# Patient Record
Sex: Male | Born: 1975 | Hispanic: No | Marital: Married | Smoking: Former smoker
Health system: Southern US, Community
[De-identification: ages and names within clinical notes are randomized; demographics above are authoritative.]

## PROBLEM LIST (undated history)

## (undated) DIAGNOSIS — G43909 Migraine, unspecified, not intractable, without status migrainosus: Secondary | ICD-10-CM

## (undated) DIAGNOSIS — T4145XA Adverse effect of unspecified anesthetic, initial encounter: Secondary | ICD-10-CM

## (undated) DIAGNOSIS — K219 Gastro-esophageal reflux disease without esophagitis: Secondary | ICD-10-CM

## (undated) DIAGNOSIS — F329 Major depressive disorder, single episode, unspecified: Secondary | ICD-10-CM

## (undated) DIAGNOSIS — K602 Anal fissure, unspecified: Secondary | ICD-10-CM

## (undated) DIAGNOSIS — F32A Depression, unspecified: Secondary | ICD-10-CM

## (undated) DIAGNOSIS — T8859XA Other complications of anesthesia, initial encounter: Secondary | ICD-10-CM

## (undated) DIAGNOSIS — F419 Anxiety disorder, unspecified: Secondary | ICD-10-CM

## (undated) HISTORY — DX: Gastro-esophageal reflux disease without esophagitis: K21.9

## (undated) HISTORY — DX: Anal fissure, unspecified: K60.2

## (undated) HISTORY — DX: Migraine, unspecified, not intractable, without status migrainosus: G43.909

## (undated) HISTORY — DX: Major depressive disorder, single episode, unspecified: F32.9

## (undated) HISTORY — PX: TONSILLECTOMY: SUR1361

## (undated) HISTORY — DX: Anxiety disorder, unspecified: F41.9

## (undated) HISTORY — PX: UPPER GASTROINTESTINAL ENDOSCOPY: SHX188

## (undated) HISTORY — DX: Depression, unspecified: F32.A

---

## 2006-05-21 ENCOUNTER — Ambulatory Visit: Payer: Self-pay | Admitting: Internal Medicine

## 2006-07-08 ENCOUNTER — Ambulatory Visit: Payer: Self-pay | Admitting: Internal Medicine

## 2006-10-07 ENCOUNTER — Ambulatory Visit: Payer: Self-pay | Admitting: Internal Medicine

## 2007-05-20 ENCOUNTER — Encounter: Payer: Self-pay | Admitting: *Deleted

## 2007-12-10 ENCOUNTER — Telehealth: Payer: Self-pay | Admitting: Internal Medicine

## 2007-12-30 ENCOUNTER — Ambulatory Visit: Payer: Self-pay | Admitting: Internal Medicine

## 2007-12-30 DIAGNOSIS — K612 Anorectal abscess: Secondary | ICD-10-CM | POA: Insufficient documentation

## 2007-12-30 DIAGNOSIS — F411 Generalized anxiety disorder: Secondary | ICD-10-CM | POA: Insufficient documentation

## 2007-12-30 DIAGNOSIS — K5730 Diverticulosis of large intestine without perforation or abscess without bleeding: Secondary | ICD-10-CM | POA: Insufficient documentation

## 2007-12-30 DIAGNOSIS — H109 Unspecified conjunctivitis: Secondary | ICD-10-CM | POA: Insufficient documentation

## 2007-12-30 DIAGNOSIS — G43009 Migraine without aura, not intractable, without status migrainosus: Secondary | ICD-10-CM | POA: Insufficient documentation

## 2008-02-28 ENCOUNTER — Ambulatory Visit: Payer: Self-pay | Admitting: Internal Medicine

## 2008-06-19 ENCOUNTER — Telehealth (INDEPENDENT_AMBULATORY_CARE_PROVIDER_SITE_OTHER): Payer: Self-pay | Admitting: *Deleted

## 2008-08-10 ENCOUNTER — Ambulatory Visit: Payer: Self-pay | Admitting: Internal Medicine

## 2008-10-06 ENCOUNTER — Ambulatory Visit: Payer: Self-pay | Admitting: Internal Medicine

## 2008-11-13 ENCOUNTER — Ambulatory Visit: Payer: Self-pay | Admitting: Internal Medicine

## 2009-01-12 ENCOUNTER — Ambulatory Visit: Payer: Self-pay | Admitting: Internal Medicine

## 2009-04-03 ENCOUNTER — Ambulatory Visit: Payer: Self-pay | Admitting: Internal Medicine

## 2010-08-01 ENCOUNTER — Ambulatory Visit: Payer: Self-pay | Admitting: Internal Medicine

## 2010-10-11 ENCOUNTER — Ambulatory Visit
Admission: RE | Admit: 2010-10-11 | Discharge: 2010-10-11 | Payer: Self-pay | Source: Home / Self Care | Attending: Internal Medicine | Admitting: Internal Medicine

## 2010-10-21 ENCOUNTER — Ambulatory Visit
Admission: RE | Admit: 2010-10-21 | Discharge: 2010-10-21 | Payer: Self-pay | Source: Home / Self Care | Attending: Internal Medicine | Admitting: Internal Medicine

## 2011-02-07 NOTE — Assessment & Plan Note (Signed)
[facility]                                 ON-CALL NOTE   NAME:CUDADerrell, Milanes                         MRN:          [id number]  DATE:10/31/2007                            DOB:          1976-09-16    TIME OF CALL:  2:22 p.m.   CALLER:  Elisah Parmer.   He sees Dr. Jonny Ruiz.   PHONE NUMBER:  [phone].   The patient is calling complaining of severe back pain.  He says he  develops pain in his lower back from time to time, and usually over-the-  counter pain medications are adequate.  Today, he has had some back pain  again, which is not responding to 800 mg of Motrin every 6 hours.  He  has been resting and applying heat.  He has no known drug allergies.  My  response is to call in Flexeril 10 mg to take every 8 hours as needed  #30 with no refills to [facility] at [phone].  He can follow up with Dr. Jonny Ruiz  later this week if he does not improve.     Tera Mater. Clent Ridges, MD  Electronically Signed    SAF/MedQ  DD: 10/31/2007  DT: 11/01/2007  Job #: [id number]

## 2011-03-03 ENCOUNTER — Encounter: Payer: Self-pay | Admitting: Internal Medicine

## 2011-03-03 ENCOUNTER — Ambulatory Visit (INDEPENDENT_AMBULATORY_CARE_PROVIDER_SITE_OTHER): Payer: BC Managed Care – PPO | Admitting: Internal Medicine

## 2011-03-03 VITALS — BP 106/78 | HR 84 | Temp 97.8°F | Ht 68.5 in | Wt 201.0 lb

## 2011-03-03 DIAGNOSIS — F419 Anxiety disorder, unspecified: Secondary | ICD-10-CM

## 2011-03-03 DIAGNOSIS — J329 Chronic sinusitis, unspecified: Secondary | ICD-10-CM

## 2011-03-03 DIAGNOSIS — F411 Generalized anxiety disorder: Secondary | ICD-10-CM

## 2011-03-03 LAB — CBC WITH DIFFERENTIAL/PLATELET
Eosinophils Absolute: 0.1 10*3/uL (ref 0.0–0.7)
Lymphocytes Relative: 45 % (ref 12–46)
Lymphs Abs: 2.9 10*3/uL (ref 0.7–4.0)
Neutro Abs: 2.9 10*3/uL (ref 1.7–7.7)
Neutrophils Relative %: 46 % (ref 43–77)
Platelets: 145 10*3/uL — ABNORMAL LOW (ref 150–400)
RBC: 5.6 MIL/uL (ref 4.22–5.81)
WBC: 6.3 10*3/uL (ref 4.0–10.5)

## 2011-03-04 ENCOUNTER — Encounter: Payer: Self-pay | Admitting: Internal Medicine

## 2011-03-04 NOTE — Progress Notes (Signed)
  Subjective:    Patient ID: Brad Holland, male    DOB: June 21, 1976, 35 y.o.   MRN: [id number]  HPI URI symptoms for apprx 3 weeks with persistent congestion. Some fatigue. Did have some productive cough which has improved.Going to [place] in a couple of weeks. Some anxiety about trip. Last trip to [place] had to use oxygen briefly because of anxiety. Took more Klonopin on return trip and did better.    Review of Systems     Objective:   Physical Exam  HENT:  Head: Normocephalic.  Right Ear: External ear normal.  Left Ear: External ear normal.  Mouth/Throat: Oropharynx is clear and moist. No oropharyngeal exudate.  Eyes: Right eye exhibits no discharge. Left eye exhibits no discharge. No scleral icterus.  Neck: Neck supple.  Pulmonary/Chest: Breath sounds normal. No respiratory distress. He has no wheezes. He has no rales. He exhibits no tenderness.  Lymphadenopathy:    He has no cervical adenopathy.           Assessment & Plan:  Sinusitis Anxiety Plan is for pt to take enough Klonopin prior to airline departure to avoid panic disorder. Have prescribed Augmentin 875 mg bid for 10 days with 1 refill

## 2011-03-04 NOTE — Patient Instructions (Signed)
Take meds as directed. Call if symptoms do not improve

## 2011-03-11 ENCOUNTER — Telehealth: Payer: Self-pay | Admitting: Internal Medicine

## 2011-03-11 NOTE — Telephone Encounter (Signed)
Not worried. Some people have slightly low counts. And I did notice this trend in him previously.

## 2011-05-27 ENCOUNTER — Other Ambulatory Visit: Payer: Self-pay | Admitting: *Deleted

## 2011-05-27 MED ORDER — ESCITALOPRAM OXALATE 20 MG PO TABS: ORAL_TABLET | ORAL | Status: DC

## 2011-08-04 ENCOUNTER — Ambulatory Visit (INDEPENDENT_AMBULATORY_CARE_PROVIDER_SITE_OTHER): Payer: BC Managed Care – PPO | Admitting: Internal Medicine

## 2011-08-04 ENCOUNTER — Encounter: Payer: Self-pay | Admitting: Internal Medicine

## 2011-08-04 VITALS — BP 116/84 | HR 72 | Temp 97.7°F | Wt 204.0 lb

## 2011-08-04 DIAGNOSIS — F411 Generalized anxiety disorder: Secondary | ICD-10-CM

## 2011-08-04 DIAGNOSIS — J069 Acute upper respiratory infection, unspecified: Secondary | ICD-10-CM

## 2011-08-04 DIAGNOSIS — F419 Anxiety disorder, unspecified: Secondary | ICD-10-CM

## 2011-08-04 NOTE — Progress Notes (Signed)
  Subjective:    Patient ID: Brad Holland, male    DOB: Jan 07, 1976, 35 y.o.   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 He had some GI issues with diarrhea and developed respiratory congestion. Has not gotten completely better with respiratory congestion. Also is felt some odd tingling when he takes a deep breath throughout his neck and shoulders and arms. It's almost like  hyperventilation syndrome. He is anxious. He's worried about his history of smoking. Has not smoked any in 3 weeks says he was in [place]. Was found to one or 2 cigarettes daily. Previously smoked for some 15 years but quit about 3 years ago and never smoked very heavily. Now is worried about lung cancer. He has a dry cough.    Review of Systems     Objective:   Physical Exam HEENT exam: TMs and pharynx are clear; neck is supple without significant adenopathy; chest: clear to auscultation without rales or wheezes.        Assessment & Plan:  Upper respiratory infection  Anxiety  Paresthesias  Plan: Patient given prescription for Levaquin 500 milligrams daily for 10 days. Reassured patient that it was unlikely he has serious illness. He will call if not better in 2 weeks.

## 2011-08-05 NOTE — Patient Instructions (Signed)
Please take antibiotic as prescribed. Call if not better in 2 weeks.

## 2011-08-08 DIAGNOSIS — F411 Generalized anxiety disorder: Secondary | ICD-10-CM

## 2011-08-11 ENCOUNTER — Other Ambulatory Visit: Payer: Self-pay

## 2011-08-11 MED ORDER — CLONAZEPAM 0.5 MG PO TABS: 0.5000 mg | ORAL_TABLET | Freq: Three times a day (TID) | ORAL | Status: DC | PRN

## 2011-08-26 ENCOUNTER — Encounter: Payer: Self-pay | Admitting: Internal Medicine

## 2011-08-26 ENCOUNTER — Ambulatory Visit (INDEPENDENT_AMBULATORY_CARE_PROVIDER_SITE_OTHER): Payer: BC Managed Care – PPO | Admitting: Internal Medicine

## 2011-08-26 VITALS — BP 114/80 | Temp 97.3°F | Wt 204.0 lb

## 2011-08-26 DIAGNOSIS — F419 Anxiety disorder, unspecified: Secondary | ICD-10-CM

## 2011-08-26 DIAGNOSIS — F411 Generalized anxiety disorder: Secondary | ICD-10-CM

## 2011-08-26 DIAGNOSIS — R209 Unspecified disturbances of skin sensation: Secondary | ICD-10-CM

## 2011-08-26 DIAGNOSIS — R0789 Other chest pain: Secondary | ICD-10-CM

## 2011-08-26 DIAGNOSIS — R202 Paresthesia of skin: Secondary | ICD-10-CM

## 2011-08-26 NOTE — Patient Instructions (Addendum)
Please be reassured that her pulse oximetry is normal. Please have chest x-ray and return later this week for fasting lab work. We will get an   appointment for you to see cardiologist. Appontment scheduled with Dr. Excell Seltzer at [facility] cardiology for Jan. 8th at 9:15. Patient informed.

## 2011-08-26 NOTE — Progress Notes (Signed)
  Subjective:    Patient ID: Brad Holland, male    DOB: 1975-09-29, 35 y.o.   MRN: [id number]  HPI patient seen here recently with what was thought to be basically anxiety. He has been having some vague chest tightness and some paresthesias down both arms. Says at times he is a bit short of breath. Doesn't really exercise very much. He is a former smoker. Never smoked more than a half to one pack a week but smoked for several years. Male thinks he might have lung cancer. He sees a Warden/ranger and recently saw a psychiatrist. His Lexapro was increased to 40 mg daily. He has Klonopin for anxiety. Both psychiatrist and psychologist suggested that he have his physical complaints completely checked out. That is why he is back today. His pulse oximetry is 97% on room air. Attempted to reassure him. He also has some paresthesias down his back which she says persist throughout the day. 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 Denies having stress at work or at home. Long-standing history of anxiety disorder. Paresthesias are more pronounced when he takes a deep breath.    Review of Systems     Objective:   Physical Exam HEENT exam: He has boggy nasal mucosa bilaterally, TMs are clear, pharynx is clear, neck is supple without adenopathy. His chest is clear to auscultation. Cardiac exam regular rate and rhythm normal S1 and S2 without murmur. Abdomen no hepatosplenomegaly masses or tenderness. Deep tendon reflexes are 2+ and symmetrical in the upper and lower extremities. Muscle strength is normal in the arms and legs.        Assessment & Plan:  It is my opinion that he has mostly an anxiety disorder based on the fact that the paresthesias seem to be more pronounced when he takes deep breaths much like hyperventilation syndrome. However he complains of some vague chest tightness at rest. No fracture chest pain. EKG reveals some borderline LVH. He is quite anxious about the results of his EKG. Is eager to have  chest x-ray to rule out lung cancer. He will have that in the near future. We'll set him up to see cardiologist for further evaluation of chest discomfort. Continue with Lexapro and Klonopin. He will return for fasting lab work later this week.

## 2011-08-27 NOTE — Progress Notes (Signed)
Addended by: Judy Pimple on: 08/27/2011 01:14 PM   Modules accepted: Orders

## 2011-08-29 ENCOUNTER — Other Ambulatory Visit (INDEPENDENT_AMBULATORY_CARE_PROVIDER_SITE_OTHER): Payer: BC Managed Care – PPO | Admitting: Internal Medicine

## 2011-08-29 ENCOUNTER — Ambulatory Visit
Admission: RE | Admit: 2011-08-29 | Discharge: 2011-08-29 | Disposition: A | Discharge status: Home / Self Care | Payer: BC Managed Care – PPO | Source: Ambulatory Visit | Attending: Internal Medicine | Admitting: Internal Medicine

## 2011-08-29 DIAGNOSIS — Z0000 Encounter for general adult medical examination without abnormal findings: Secondary | ICD-10-CM

## 2011-08-29 DIAGNOSIS — R0789 Other chest pain: Secondary | ICD-10-CM

## 2011-08-29 DIAGNOSIS — R0602 Shortness of breath: Secondary | ICD-10-CM

## 2011-08-29 LAB — CBC WITH DIFFERENTIAL/PLATELET
Basophils Absolute: 0 10*3/uL (ref 0.0–0.1)
Basophils Relative: 0 % (ref 0–1)
MCHC: 34.4 g/dL (ref 30.0–36.0)
Neutro Abs: 3.3 10*3/uL (ref 1.7–7.7)
Neutrophils Relative %: 51 % (ref 43–77)
RDW: 13.8 % (ref 11.5–15.5)

## 2011-08-29 LAB — COMPREHENSIVE METABOLIC PANEL
ALT: 25 U/L (ref 0–53)
AST: 24 U/L (ref 0–37)
Albumin: 4.7 g/dL (ref 3.5–5.2)
Alkaline Phosphatase: 47 U/L (ref 39–117)
BUN: 12 mg/dL (ref 6–23)
Potassium: 4 mEq/L (ref 3.5–5.3)
Sodium: 139 mEq/L (ref 135–145)
Total Protein: 7.5 g/dL (ref 6.0–8.3)

## 2011-08-29 LAB — LIPID PANEL
HDL: 32 mg/dL — ABNORMAL LOW (ref >39)
LDL Cholesterol: 136 mg/dL — ABNORMAL HIGH (ref 0–99)

## 2011-09-30 ENCOUNTER — Encounter: Payer: Self-pay | Admitting: Cardiovascular Disease

## 2011-09-30 ENCOUNTER — Ambulatory Visit (INDEPENDENT_AMBULATORY_CARE_PROVIDER_SITE_OTHER): Payer: BC Managed Care – PPO | Admitting: Cardiovascular Disease

## 2011-09-30 VITALS — BP 121/81 | HR 91 | Ht 69.0 in | Wt 206.0 lb

## 2011-09-30 DIAGNOSIS — R079 Chest pain, unspecified: Secondary | ICD-10-CM | POA: Insufficient documentation

## 2011-09-30 DIAGNOSIS — I517 Cardiomegaly: Secondary | ICD-10-CM | POA: Insufficient documentation

## 2011-09-30 DIAGNOSIS — R072 Precordial pain: Secondary | ICD-10-CM

## 2011-09-30 NOTE — Patient Instructions (Signed)
Your physician has requested that you have an echocardiogram. Echocardiography is a painless test that uses sound waves to create images of your heart. It provides your doctor with information about the size and shape of your heart and how well your heart's chambers and valves are working. This procedure takes approximately one hour. There are no restrictions for this procedure.  Your physician has requested that you have an exercise tolerance test. For further information please visit [email or web address]. Please also follow instruction sheet, as given.   

## 2011-09-30 NOTE — Progress Notes (Signed)
HPI:  Brad Holland is a very nice 36 year old gentleman presenting for initial evaluation of chest pain. The patient has experienced symptoms of chest tightness across the anterior chest with associated tingling down both arms. He has not had radiation of chest pain up to the neck or jaw area. He's had no exertional chest pain or tightness, but he has not been particularly active over the last several months. He associates his symptoms with times of high stress. 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 He has a lot of problems with anxiety and he has experienced a multitude of physical symptoms in the past that he relates to underlying anxiety.  The patient denies dyspnea with exertion. He denies palpitations, syncope, orthopnea, or PND. He does admit to lightheadedness with postural changes.  The patient has chronic headaches and he takes Maxalt on occasion to treat migraines.  Outpatient Encounter Prescriptions as of 09/30/2011  Medication Sig Dispense Refill  . clonazePAM (KLONOPIN) 0.5 MG tablet Take 1 tablet (0.5 mg total) by mouth 3 (three) times daily as needed.  90 tablet  2  . escitalopram (LEXAPRO) 20 MG tablet 2 tabs po qd       . ibuprofen (ADVIL,MOTRIN) 200 MG tablet Take 200 mg by mouth every 6 (six) hours as needed.        . Multiple Vitamin (MULTIVITAMIN) tablet Take 1 tablet by mouth daily.        . rizatriptan (MAXALT) 10 MG tablet Take 10 mg by mouth as needed. May repeat in 2 hours if needed       . DISCONTD: escitalopram (LEXAPRO) 20 MG tablet Take 1 1/2 tab daily  45 tablet  0    Review of patient's allergies indicates not on file.  Past Medical History  Diagnosis Date  . Anxiety   . Depression   . Migraine headache     Past Surgical History  Procedure Date  . Tonsillectomy     History   Social History  . Marital Status: Married    Spouse Name: N/A    Number of  Children: N/A  . Years of Education: N/A   Occupational History  . Not on file.   Social History Main Topics  . Smoking status: Former Smoker    Types: Cigarettes    Quit date: 06/07/2009  . Smokeless tobacco: Never Used  . Alcohol Use: No  . Drug Use: Not on file  . Sexually Active: Not on file   Other Topics Concern  . Not on file   Social History Narrative  . No narrative on file    No family history on file.  ROS: General: no fevers/chills/night sweats Eyes: no blurry vision, diplopia, or amaurosis ENT: no sore throat or hearing loss Resp: no cough, wheezing, or hemoptysis CV: no edema or palpitations GI: positive for dyspepsia GU: no dysuria, frequency, or hematuria Skin: no rash Neuro: no headache, numbness, tingling, or weakness of extremities Musculoskeletal: no joint pain or swelling Heme: no bleeding, DVT, or easy bruising Endo: no polydipsia or polyuria  BP 121/81  Pulse 91  Ht 5\' 9"  (1.753 m)  Wt 93.441 kg (206 lb)  BMI 30.42 kg/m2  PHYSICAL EXAM: Pt is alert and oriented, WD, WN, in no distress. HEENT: normal Neck: JVP normal. Carotid upstrokes normal without bruits. No thyromegaly. Lungs: equal expansion, clear bilaterally CV: Apex is discrete and  nondisplaced, RRR without murmur or gallop Abd: soft, NT, +BS, no bruit, no hepatosplenomegaly Back: no CVA tenderness Ext: no C/C/E        Femoral pulses 2+= without bruits        DP/PT pulses intact and = Skin: warm and dry without rash Neuro: CNII-XII intact             Strength intact = bilaterally  EKG:  EKG dated August 26, 2011 shows normal sinus rhythm with borderline left ventricular hypertrophy, otherwise within normal limits.  ASSESSMENT AND PLAN:

## 2011-09-30 NOTE — Assessment & Plan Note (Signed)
The patient has borderline changes of LVH on his ECG. I have recommended a 2-D echocardiogram to exclude significant LVH or other underlying structural heart disease.

## 2011-09-30 NOTE — Assessment & Plan Note (Signed)
The patient has chest pain at rest, associated with emotional stress. There are both typical and atypical features. His overall risk of obstructive CAD and cardiac disease is low. I have recommended an exercise treadmill study for further evaluation. If this study is negative then patient reassurance will be appropriate.

## 2011-10-01 ENCOUNTER — Other Ambulatory Visit (HOSPITAL_COMMUNITY): Payer: BC Managed Care – PPO | Admitting: Radiology

## 2011-10-02 ENCOUNTER — Ambulatory Visit (HOSPITAL_COMMUNITY): Payer: BC Managed Care – PPO | Attending: Cardiology | Admitting: Radiology

## 2011-10-02 DIAGNOSIS — R072 Precordial pain: Secondary | ICD-10-CM

## 2011-10-02 DIAGNOSIS — I10 Essential (primary) hypertension: Secondary | ICD-10-CM | POA: Insufficient documentation

## 2011-10-02 DIAGNOSIS — E669 Obesity, unspecified: Secondary | ICD-10-CM | POA: Insufficient documentation

## 2011-10-02 DIAGNOSIS — R079 Chest pain, unspecified: Secondary | ICD-10-CM | POA: Insufficient documentation

## 2011-10-03 ENCOUNTER — Telehealth: Payer: Self-pay | Admitting: Cardiovascular Disease

## 2011-10-03 NOTE — Telephone Encounter (Signed)
Fu call °Pt returning your call  °

## 2011-10-03 NOTE — Telephone Encounter (Signed)
Pt aware of ECHO results by phone.

## 2011-10-17 ENCOUNTER — Ambulatory Visit: Payer: BC Managed Care – PPO | Admitting: Internal Medicine

## 2011-10-17 ENCOUNTER — Ambulatory Visit (INDEPENDENT_AMBULATORY_CARE_PROVIDER_SITE_OTHER): Payer: BC Managed Care – PPO | Admitting: Internal Medicine

## 2011-10-17 ENCOUNTER — Encounter: Payer: BC Managed Care – PPO | Admitting: Cardiovascular Disease

## 2011-10-17 ENCOUNTER — Encounter: Payer: Self-pay | Admitting: Internal Medicine

## 2011-10-17 VITALS — BP 98/66 | HR 88 | Temp 98.1°F | Wt 204.0 lb

## 2011-10-17 DIAGNOSIS — J019 Acute sinusitis, unspecified: Secondary | ICD-10-CM

## 2011-10-17 DIAGNOSIS — F411 Generalized anxiety disorder: Secondary | ICD-10-CM

## 2011-10-17 DIAGNOSIS — J40 Bronchitis, not specified as acute or chronic: Secondary | ICD-10-CM

## 2011-10-17 DIAGNOSIS — Z87891 Personal history of nicotine dependence: Secondary | ICD-10-CM

## 2011-10-17 DIAGNOSIS — F419 Anxiety disorder, unspecified: Secondary | ICD-10-CM

## 2011-10-18 ENCOUNTER — Encounter: Payer: Self-pay | Admitting: Internal Medicine

## 2011-10-18 DIAGNOSIS — Z87891 Personal history of nicotine dependence: Secondary | ICD-10-CM | POA: Insufficient documentation

## 2011-10-18 NOTE — Progress Notes (Signed)
  Subjective:    Patient ID: Brad Holland, male    DOB: Jun 13, 1976, 36 y.o.   MRN: [id number]  HPI Patient has a 3 week history of URI symptoms. Now has maxillary sinus pressure, postnasal drip but little cough. No fever or shaking chills. Children have been sick with similar illness. With regard to anxiety, Lexapro was increased with good results with regard anxiety symptoms. Patient has a history of smoking. Patient complaining of discolored sputum production. Has nasal congestion.    Review of Systems     Objective:   Physical Exam HEENT exam: TMs are clear; right nasal mucosa quite boggy. Pharynx slightly injected; no exudate noted. Neck is supple without adenopathy. Chest clear to auscultation.        Assessment & Plan:  Sinusitis  Bronchitis  History of smoking  Anxiety  Plan: Levaquin 500 milligrams daily for 10 days. Take with meals. If cough suppressant needed, gave him prescription for Hycodan( 8 ounces) 1 teaspoon by mouth every 6 hours when necessary for cough.

## 2011-10-18 NOTE — Patient Instructions (Signed)
Take Levaquin 500 milligrams daily for 10 days with medial. You have been given prescription for Hycodan if needed for cough.

## 2011-10-30 ENCOUNTER — Ambulatory Visit (INDEPENDENT_AMBULATORY_CARE_PROVIDER_SITE_OTHER): Payer: BC Managed Care – PPO | Admitting: Cardiovascular Disease

## 2011-10-30 DIAGNOSIS — R072 Precordial pain: Secondary | ICD-10-CM

## 2011-10-30 NOTE — Progress Notes (Signed)
Exercise Treadmill Test  Pre-Exercise Testing Evaluation Rhythm: normal sinus  Rate: 81   PR:  15 QRS:  .08  QT:  38 QTc: 44     Test  Exercise Tolerance Test Ordering MD: Tonny Bollman, MD  Interpreting MD:  Tonny Bollman, MD  Unique Test No: 1  Treadmill:  1  Indication for ETT: chest pain - rule out ischemia  Contraindication to ETT: No   Stress Modality: exercise - treadmill  Cardiac Imaging Performed: non   Protocol: standard Bruce - maximal  Max BP:  135/57  Max MPHR (bpm):  184 85% MPR (bpm):  156  MPHR obtained (bpm):  171 % MPHR obtained:  94  Reached 85% MPHR (min:sec):  9:00 Total Exercise Time (min-sec):  10:00  Workload in METS:  11.7 Borg Scale: 17  Reason ETT Terminated:  dyspnea    ST Segment Analysis At Rest: normal ST segments - no evidence of significant ST depression With Exercise: no evidence of significant ST depression  Other Information Arrhythmia:  Yes Angina during ETT:  absent (0) Quality of ETT:  diagnostic  ETT Interpretation:  normal - no evidence of ischemia by ST analysis  Comments: Good exercise tolerance. No angina or significant ST change with exertion. Isolated PVC's present.  Recommendations: Graded exercise program.

## 2011-10-30 NOTE — Patient Instructions (Signed)
Your physician recommends that you schedule a follow-up appointment as needed  

## 2011-11-20 ENCOUNTER — Encounter: Payer: Self-pay | Admitting: Internal Medicine

## 2011-11-20 ENCOUNTER — Ambulatory Visit (INDEPENDENT_AMBULATORY_CARE_PROVIDER_SITE_OTHER): Payer: BC Managed Care – PPO | Admitting: Internal Medicine

## 2011-11-20 VITALS — BP 100/76 | Temp 97.0°F | Wt 207.0 lb

## 2011-11-20 DIAGNOSIS — H6501 Acute serous otitis media, right ear: Secondary | ICD-10-CM

## 2011-11-20 DIAGNOSIS — H6500 Acute serous otitis media, unspecified ear: Secondary | ICD-10-CM

## 2011-11-20 DIAGNOSIS — J029 Acute pharyngitis, unspecified: Secondary | ICD-10-CM

## 2011-11-20 DIAGNOSIS — J040 Acute laryngitis: Secondary | ICD-10-CM

## 2011-11-20 DIAGNOSIS — J069 Acute upper respiratory infection, unspecified: Secondary | ICD-10-CM

## 2011-11-20 MED ORDER — METHYLPREDNISOLONE ACETATE PF 80 MG/ML IJ SUSP
80.0000 mg | Freq: Once | INTRAMUSCULAR | Status: AC
  Administered 2011-11-20: 80 mg via INTRAMUSCULAR

## 2011-11-20 NOTE — Progress Notes (Signed)
  Subjective:    Patient ID: Brad Holland, male    DOB: Jun 23, 1976, 36 y.o.   MRN: [id number]  HPI Patient seen January 25 for respiratory infection treated with Levaquin and hydrocodone cough syrup. Now has 4-5 day history of nasal congestion, cough, and laryngitis. Leaving for [place] , [place] by car today. Wife has similar illness. Says he's had  some discolored sputum production. No fever or shaking chills. History of anxiety treated with Klonopin. History of migraine headaches.    Review of Systems     Objective:   Physical Exam HEENT exam: Very boggy nasal mucosa particularly in right nostril. Pharynx is only slightly injected. Rapid strep screen negative. Left TM obscured by cerumen. Right TM full but not red. Neck is supple without adenopathy. Chest clear. Voice is hoarse        Assessment & Plan:  URI  Laryngitis  Right serous otitis media  Possible allergic rhinitis  Plan: Augmentin 500 mg 3 times daily for 10 days. Depo-Medrol 80 mg IM given in office today.

## 2011-11-20 NOTE — Patient Instructions (Signed)
Take Augmentin 500 mg 3 times daily for 10 days. You have been given injection of Depo-Medrol here in the office to help with nasal congestion. If symptoms persist, we will refer you to allergist for allergy testing.

## 2012-02-25 ENCOUNTER — Other Ambulatory Visit: Payer: Self-pay | Admitting: Chiropractic Medicine

## 2012-02-25 ENCOUNTER — Ambulatory Visit
Admission: RE | Admit: 2012-02-25 | Discharge: 2012-02-25 | Disposition: A | Discharge status: Home / Self Care | Payer: BC Managed Care – PPO | Source: Ambulatory Visit | Attending: Chiropractic Medicine | Admitting: Chiropractic Medicine

## 2012-02-25 DIAGNOSIS — M549 Dorsalgia, unspecified: Secondary | ICD-10-CM

## 2012-04-01 ENCOUNTER — Telehealth: Payer: Self-pay | Admitting: Cardiovascular Disease

## 2012-04-01 NOTE — Telephone Encounter (Signed)
New msg Pt wants to discuss routine test for chest tightness on [phone]. He had an echo done. Pt is getting bill. Please call

## 2012-04-02 NOTE — Telephone Encounter (Signed)
I called the pt and he has already spoken with Jeanella Anton our director about his bill.

## 2012-07-21 ENCOUNTER — Ambulatory Visit (INDEPENDENT_AMBULATORY_CARE_PROVIDER_SITE_OTHER): Payer: BC Managed Care – PPO | Admitting: Internal Medicine

## 2012-07-21 DIAGNOSIS — Z23 Encounter for immunization: Secondary | ICD-10-CM

## 2012-07-22 ENCOUNTER — Ambulatory Visit: Payer: BC Managed Care – PPO | Admitting: Internal Medicine

## 2012-07-22 DIAGNOSIS — Z23 Encounter for immunization: Secondary | ICD-10-CM

## 2012-07-23 ENCOUNTER — Telehealth: Payer: Self-pay | Admitting: Internal Medicine

## 2012-07-23 DIAGNOSIS — M25519 Pain in unspecified shoulder: Secondary | ICD-10-CM

## 2012-07-23 NOTE — Telephone Encounter (Signed)
Patient called right back to state that it was NOT a neurosurgeon, it was an orthopedic with [facility].  Advised patient I would update the record to indicate.  Dr. Phoebe Perch is an orthopedic doc.

## 2012-07-23 NOTE — Telephone Encounter (Signed)
Were contacted recently by physical therapy regarding a prescription for this patient. We have not seen him for any orthopedic compliant. I called patient today to clarify situation. Apparently he had some back pain several months ago and saw a Land. Subsequently saw an orthopedist and had MRI of the LS spine showed degenerative disc disease. I do not have a copy of that report. Is not in Epic. He cannot recall the name of orthopedist right now.he tried self referred himself to the physical therapist thinking it would be covered by insurance. Explained to him that he can go up a out-of-pocket for physical therapy but it will not be covered by insurance unless a doctor sees him in right sore for physical therapy. This was needs to him. He's been swimming now for 3 weeks and has developed some right scapular pain. He wants to go to physical therapy for that. He says he's gotten in shape and his back pain has improved just by exercising and core strengthening exercises. This was advised by orthopedist.explained to him that we are providing  prescription for him to go to physical therapy for his shoulder but if symptoms do not improve he needs to return to orthopedist.explained to him that in the future he would need to be seen by a physician before receiving physical therapy to obtain an order for such. He says he was unaware of the system requirements and apologized.

## 2012-08-31 ENCOUNTER — Ambulatory Visit (INDEPENDENT_AMBULATORY_CARE_PROVIDER_SITE_OTHER): Payer: BC Managed Care – PPO | Admitting: Internal Medicine

## 2012-08-31 ENCOUNTER — Encounter: Payer: Self-pay | Admitting: Internal Medicine

## 2012-08-31 VITALS — BP 102/72 | HR 72 | Temp 97.2°F | Wt 202.0 lb

## 2012-08-31 DIAGNOSIS — F419 Anxiety disorder, unspecified: Secondary | ICD-10-CM

## 2012-08-31 DIAGNOSIS — F411 Generalized anxiety disorder: Secondary | ICD-10-CM

## 2012-08-31 DIAGNOSIS — M542 Cervicalgia: Secondary | ICD-10-CM

## 2012-08-31 DIAGNOSIS — M25519 Pain in unspecified shoulder: Secondary | ICD-10-CM

## 2012-08-31 DIAGNOSIS — M25512 Pain in left shoulder: Secondary | ICD-10-CM

## 2012-08-31 NOTE — Progress Notes (Signed)
  Subjective:    Patient ID: Brad Holland, male    DOB: 1976/02/05, 36 y.o.   MRN: [id number]  HPI Back in March injured lower back digging in garden. Had this for 3 months without relief. Went to see Chiropractor, Dr. Wilma Flavin. Did one manipulation and did well. Second manipulation resulted in a lot of back pain. Kept going to chiropractor for 3 months. Back slowly improved. Referred to Dr. 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 Advised exercise and PT to build up core and lose weight. Started swimming and developed left shoulder pain. Pain would have  Onset a couple of hours after swimming. Gradually built up swimming to 30 laps a ta time. Got a swim coach for stroke improvement. Would have neck pain as well. Pain in shoulder is left parascapular. Righthanded. Stopped swimming and went to gym to do treadmill. Excrucuating pain in shoulder blade after exercising. Went to Physical therapy 3 times. Applies ice or heat to neck and shoulder which helps some. Saw 3 different physical therapists and is frustrated at this point and tired of hurting. Needs note to excuse from gym exercise. No radiculopathy symptoms. Also has been changed from Lexapro to Cymbalta by psychiatrist Dr. Ellis Savage. Was having anxiety issues not well controlled anymore with  Lexapro.    Review of Systems     Objective:   Physical Exam Deep tendon reflexes left upper extremity are all normal. Muscle strength in the left upper extremity is normal. He has several trigger points along left medial scapula. He has palpable spasm in left trapezius and sternocleidomastoid muscle areas. Muscle strength is 5 over 5 in the left upper extremity. He is slightly anxious.          Assessment & Plan:  Left scapular pain  Left neck pain  Anxiety  Plan: Four trigger point injections along left medial parascapular area using Depo-Medrol, Xylocaine and Marcaine. Spent over 25  minutes  reassuring patient , examining patient and taking close history. He will return to physical therapy with my specific instructions for heat ultrasound and massage 2-3 times a week for 2-3 weeks for neck and shoulder pain. He is to avoid exercising at [facility] or swimming for 4 weeks. Note given to take to gym. He will take Flexeril 10 mg 1/2-1 tablet at bedtime. Trial of Celebrex 200 mg daily. Written prescription for #30 Celebrex 200 mg +9 sample tablets. Call if not better in 3 weeks.

## 2012-08-31 NOTE — Patient Instructions (Addendum)
Take Flexeril at bedtime. Apply ice to neck and shoulder 20 minutes twice daily. Return to physical therapy with my specific instructions. He had been given trigger point injections today. Call if not better in 3 weeks. Avoid exercising at gym for 4 weeks.

## 2012-09-02 ENCOUNTER — Telehealth: Payer: Self-pay | Admitting: Internal Medicine

## 2012-09-02 NOTE — Telephone Encounter (Signed)
Have him come by and pick up Rx for e- stimulation. Stretching will be up to physical tgherapist

## 2012-09-02 NOTE — Telephone Encounter (Signed)
Pt wanted to know if he could still stretch and if you could add E-stem to his PT.

## 2012-09-28 ENCOUNTER — Ambulatory Visit: Payer: BC Managed Care – PPO | Admitting: Internal Medicine

## 2012-09-30 ENCOUNTER — Ambulatory Visit (INDEPENDENT_AMBULATORY_CARE_PROVIDER_SITE_OTHER): Payer: BC Managed Care – PPO | Admitting: Internal Medicine

## 2012-09-30 ENCOUNTER — Encounter: Payer: Self-pay | Admitting: Internal Medicine

## 2012-09-30 VITALS — BP 108/84 | HR 72 | Wt 208.0 lb

## 2012-09-30 DIAGNOSIS — M25519 Pain in unspecified shoulder: Secondary | ICD-10-CM

## 2012-09-30 DIAGNOSIS — M25512 Pain in left shoulder: Secondary | ICD-10-CM | POA: Insufficient documentation

## 2012-09-30 NOTE — Patient Instructions (Addendum)
We will refer you to orthopedist

## 2012-09-30 NOTE — Progress Notes (Signed)
  Subjective:    Patient ID: Brad Holland, male    DOB: 06-05-1976, 37 y.o.   MRN: [id number]  HPI Despite resting and not exercising or doing physical therapy for 3 weeks he still having issues with left shoulder pain. He took Celebrex for 3 weeks. This is a bit vague. Complains of some pain over left shoulder and deltoid with abduction of left arm. Also still having some pain in. Scapular area on the left. Doesn't appear to have cervical radiculopathy although he is worried about this.    Review of Systems     Objective:   Physical Exam appears to have full range of motion left upper extremity and normal muscle strength. Complains of pain with abduction of left arm. 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

## 2013-01-26 ENCOUNTER — Other Ambulatory Visit: Payer: Self-pay

## 2013-01-26 MED ORDER — RIZATRIPTAN BENZOATE 10 MG PO TABS: 10.0000 mg | ORAL_TABLET | ORAL | Status: DC | PRN

## 2013-01-27 ENCOUNTER — Encounter: Payer: Self-pay | Admitting: Internal Medicine

## 2013-01-27 ENCOUNTER — Ambulatory Visit (INDEPENDENT_AMBULATORY_CARE_PROVIDER_SITE_OTHER): Payer: BC Managed Care – PPO | Admitting: Internal Medicine

## 2013-01-27 VITALS — BP 96/68 | Temp 98.2°F | Wt 196.0 lb

## 2013-01-27 DIAGNOSIS — L03019 Cellulitis of unspecified finger: Secondary | ICD-10-CM

## 2013-01-27 DIAGNOSIS — IMO0001 Reserved for inherently not codable concepts without codable children: Secondary | ICD-10-CM

## 2013-01-27 NOTE — Progress Notes (Signed)
  Subjective:    Patient ID: Brad Holland, male    DOB: 29-Apr-1976, 37 y.o.   MRN: [id number]  HPI Patient presents with paronychia right third finger. Has noted pus and drainage. Thinks he could of had a hangnail causing the problem. 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 Continues to have left shoulder pain and sees private physical therapist. History of migraine headaches and we recently refilled his Maxalt.    Review of Systems     Objective:   Physical Exam  erythema and drainage around the nailbed right third finger        Assessment & Plan:  Paronychia right third finger  Plan: Soak finger in warm soapy water 20 minutes twice daily for apply peroxide. Dry carefully and apply Bactroban ointment. Breasts finger with gauze. Take doxycycline 100 mg twice daily for 10 days. New prescription for Bactroban ointment. Call if not better in one week.

## 2013-01-27 NOTE — Patient Instructions (Addendum)
Soak finger in warm soapy water 20 minutes twice daily. Apply peroxide and apply Bactroban ointment. Keep covered until healed with gauze. Do not use Band-Aid. Take doxycycline 100 mg twice daily for 10 days. Call if not better in one week.

## 2013-03-15 ENCOUNTER — Encounter: Payer: Self-pay | Admitting: Internal Medicine

## 2013-03-15 ENCOUNTER — Ambulatory Visit (INDEPENDENT_AMBULATORY_CARE_PROVIDER_SITE_OTHER): Payer: BC Managed Care – PPO | Admitting: Internal Medicine

## 2013-03-15 VITALS — BP 104/78 | HR 84 | Temp 98.4°F | Ht 68.25 in | Wt 188.0 lb

## 2013-03-15 DIAGNOSIS — B353 Tinea pedis: Secondary | ICD-10-CM

## 2013-03-15 DIAGNOSIS — F411 Generalized anxiety disorder: Secondary | ICD-10-CM

## 2013-03-15 DIAGNOSIS — Z8669 Personal history of other diseases of the nervous system and sense organs: Secondary | ICD-10-CM

## 2013-03-15 DIAGNOSIS — M25512 Pain in left shoulder: Secondary | ICD-10-CM

## 2013-03-15 DIAGNOSIS — B356 Tinea cruris: Secondary | ICD-10-CM

## 2013-03-15 DIAGNOSIS — M25519 Pain in unspecified shoulder: Secondary | ICD-10-CM

## 2013-03-15 NOTE — Progress Notes (Signed)
  Subjective:    Patient ID: Brad Holland, male    DOB: 1976-01-31, 37 y.o.   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 He is going to [place] in early July to teach and [employer]echele Collin course for couple of weeks. He has a long-standing history of anxiety. Several months ago he apparently injured his left shoulder. He's had an MRI of the C-spine which was negative. He's been to physical therapy at a couple of different places.  He even saw rheumatologist, Dr. Corliss Skains who thought he might have myofascial pain syndrome. He has not had an MRI of his left shoulder. Says his left shoulder continues to hurt. He had one trigger point injection by rheumatologist which really didn't help very much. He initially injured his shoulder with swimming while trying to lose weight. He says he has lost about 28 pounds on Weight Watchers and he is pleased with that.  Ellis Savage at [facility] treats his anxiety. He was on Lexapro for a number of years but says it stopped working for his anxiety. He subsequently tried Cymbalta which cause nausea. He tried Pristiq, Celexa, Effexor. He even tried Topamax but that gave him abdominal pain. Luvox was prescribed but after reading the side effects he was afraid to try it. Doesn't think he's tried Vibryyd. He also tried Zoloft. He says he is always had anxiety ever since he was a child. Thinks he would like to try Prozac.  He also needs something for tinea cruris and tinea pedis.    Review of Systems     Objective:   Physical Exam significant tinea pedis both feet.        Assessment & Plan:    Migraine headaches Left shoulder pain Tinea pedis Tinea cruris Anxiety Plan: Dr. Teressa Senter to see when pt gets back from [facility] re: left shoulder pain. Try Prozac 10 mg daily. Lotrisone cream bid; Spectazole cream daily; Amoxicillin 500 mg tid for trip to [place]  Time spent with patient 25 minutes

## 2013-03-15 NOTE — Patient Instructions (Addendum)
Try Prozac 10 mg every other day for 10 days increasing to 10 mg daily. Use Spectazole cream on feet for tinea pedis. Use Lotrisone cream in groin for tinea cruris. Amoxicillin 500 mg 3 times daily for 10 days has been prescribed used to take on her trip to [place]. Appointment made to see Dr. Teressa Senter in mid July after you have returned from [place].

## 2013-08-23 ENCOUNTER — Ambulatory Visit (INDEPENDENT_AMBULATORY_CARE_PROVIDER_SITE_OTHER): Payer: BC Managed Care – PPO | Admitting: Internal Medicine

## 2013-08-23 ENCOUNTER — Encounter: Payer: Self-pay | Admitting: Internal Medicine

## 2013-08-23 VITALS — BP 110/74 | HR 88 | Temp 97.9°F | Ht 68.0 in | Wt 177.0 lb

## 2013-08-23 DIAGNOSIS — L02439 Carbuncle of limb, unspecified: Secondary | ICD-10-CM

## 2013-08-23 DIAGNOSIS — K602 Anal fissure, unspecified: Secondary | ICD-10-CM

## 2013-08-23 DIAGNOSIS — L02429 Furuncle of limb, unspecified: Secondary | ICD-10-CM

## 2013-09-01 ENCOUNTER — Ambulatory Visit (INDEPENDENT_AMBULATORY_CARE_PROVIDER_SITE_OTHER): Payer: BC Managed Care – PPO | Admitting: Internal Medicine

## 2013-09-01 ENCOUNTER — Encounter: Payer: Self-pay | Admitting: Internal Medicine

## 2013-09-01 VITALS — BP 110/70 | HR 68 | Temp 98.1°F | Ht 68.0 in | Wt 188.0 lb

## 2013-09-01 DIAGNOSIS — J069 Acute upper respiratory infection, unspecified: Secondary | ICD-10-CM

## 2013-09-01 DIAGNOSIS — R490 Dysphonia: Secondary | ICD-10-CM

## 2013-09-01 MED ORDER — LEVOFLOXACIN 500 MG PO TABS: 500.0000 mg | ORAL_TABLET | Freq: Every day | ORAL | Status: DC

## 2013-09-01 NOTE — Patient Instructions (Addendum)
Take Levaquin 500 milligrams daily for 7 days. 

## 2013-09-05 ENCOUNTER — Telehealth: Payer: Self-pay | Admitting: Internal Medicine

## 2013-09-05 NOTE — Telephone Encounter (Signed)
Spoke with Scarlette Calico @ Dr. Raye Sorrow office; appointment given with Dr. Raye Sorrow PA Aquilla Hacker) for 12/17 @ 8:40 a.m.  Address:  [address], [address] [phone]).  LM for patient on  [phone]; given appointment date/time, address and phone #.  Patient instructed to take a list of medications, his insurance card and ID and be prepared to pay his copay.

## 2013-10-17 ENCOUNTER — Encounter: Payer: Self-pay | Admitting: Internal Medicine

## 2013-10-17 ENCOUNTER — Ambulatory Visit (INDEPENDENT_AMBULATORY_CARE_PROVIDER_SITE_OTHER): Payer: BC Managed Care – PPO | Admitting: Internal Medicine

## 2013-10-17 VITALS — BP 114/78 | HR 84 | Temp 98.8°F | Wt 175.0 lb

## 2013-10-17 DIAGNOSIS — R198 Other specified symptoms and signs involving the digestive system and abdomen: Secondary | ICD-10-CM

## 2013-10-17 NOTE — Progress Notes (Signed)
   Subjective:    Patient ID: Brad Holland, male    DOB: [birth year], 38 y.o.   MRN: [id number]  HPI Micah FlesherWent to physical therapy today regarding his shoulder issue. He mentioned that he had felt a throbbing sensation in his upper abdomen almost like a pulsatile sensation. They suggested he seek medical attention right away. This happens generally with migraine headaches but sometimes happens without a migraine. He has a history of anxiety. 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 Says he gets hoarse after talking for 3 hours in class. He is now back on Lexapro for anxiety. He tried a number of SSRI medications but none work as well as Lexapro.    Review of Systems     Objective:   Physical Exam chest is clear to auscultation. Cardiac exam regular rate and rhythm without murmur. Abdomen bowel sounds are increased. No hepatosplenomegaly masses or significant tenderness. No bruit. No femoral bruit. Pulses are normal in the femoral areas bilaterally.        Assessment & Plan:  Pulsating sensation in the abdomen-etiology unclear. Suspect this has something to do with anxiety and possible migraine  Plan: Offered to do ultrasound of abdomen if he so desires. He's had a lot of medical expenses lately and prefers to defer this at this time which I think is fine. He was reassured.

## 2013-10-17 NOTE — Patient Instructions (Addendum)
Call if symptoms persist

## 2013-10-26 ENCOUNTER — Ambulatory Visit: Payer: BC Managed Care – PPO

## 2013-11-11 ENCOUNTER — Encounter: Payer: Self-pay | Admitting: Internal Medicine

## 2013-11-11 ENCOUNTER — Telehealth: Payer: Self-pay | Admitting: Internal Medicine

## 2013-11-11 NOTE — Telephone Encounter (Signed)
Noted typed and signed for patient.

## 2014-01-13 ENCOUNTER — Telehealth: Payer: Self-pay | Admitting: Internal Medicine

## 2014-01-13 NOTE — Telephone Encounter (Signed)
I do not understand these symptoms. We can see him next week.

## 2014-01-13 NOTE — Telephone Encounter (Signed)
Left message for patient

## 2014-01-17 ENCOUNTER — Ambulatory Visit (INDEPENDENT_AMBULATORY_CARE_PROVIDER_SITE_OTHER): Payer: BC Managed Care – PPO | Admitting: Internal Medicine

## 2014-01-17 ENCOUNTER — Encounter: Payer: Self-pay | Admitting: Internal Medicine

## 2014-01-17 VITALS — BP 120/80 | HR 91 | Temp 98.0°F | Wt 169.0 lb

## 2014-01-17 DIAGNOSIS — J309 Allergic rhinitis, unspecified: Secondary | ICD-10-CM

## 2014-01-17 MED ORDER — METHYLPREDNISOLONE ACETATE 80 MG/ML IJ SUSP
80.0000 mg | Freq: Once | INTRAMUSCULAR | Status: AC
  Administered 2014-01-17: 80 mg via INTRAMUSCULAR

## 2014-01-18 ENCOUNTER — Encounter: Payer: Self-pay | Admitting: Internal Medicine

## 2014-01-18 NOTE — Progress Notes (Addendum)
   Subjective:    Patient ID: Brad Holland, male    DOB: Sep 09, 1976, 38 y.o.   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  He developed some hoarseness in late 2014. He was seen by ENT physician, Dr. Jearld FentonByers who thought he had GE reflux and not sinusitis. Previously had been on Levaquin with no improvement in symptoms for presumed respiratory infection. He had a globus sensation and sore throat. He was placed on omeprazole 40 mg daily. He has cut out caffeine. He did not improve with these measures and return to ENT physician 10/13/2013. 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 There were subtle findings of LPR. It was recommended he take Dexilant and have dietary modifications and was referred [facility]. He has been going for voice training and says voice has improved. He also [facility], Dr. Lenard SimmerSeth Cohen who agreed with this diagnosis. He's also been going to [place] and is taking a number of dietary supplements. He is taking Butterbur with [place], Vitamin K 2, vitamin D 3, methyl B12, P2, magnesium bowel 8 and probiotics. His B12 level was normal on the lab work done in [place]. Says he supplements have improved his migraine headaches a great deal. Returns today complaining of  sensation in his mouth that is burning and also some discomfort. He's wondering about a yeast infection. Reminded him today the globus sensation is usually due to anxiety. He visited his father several weeks ago who had a history of Pseudomonas sepsis from urinary tract issues. He's worried about that also. Told him it was unlikely that he would have a Pseudomonas infection in his mouth especially with normal immune system. Patient realizes that he has a lot of physical complaints that he relates to stress  and anxiety. He is on Lexapro. He's been brushing his tongue a lot.    Review of Systems     Objective:   Physical Exam His tongue is coated in for red. His mouth is normal. No evidence of mouth lesions. Boggy nasal mucosa.       Assessment & Plan:  Glossitis  Anxiety  Probable allergic rhinitis-has boggy nasal mucosa  History of muscle tension dysphonia  Migraine headaches-improved with over-the-counter supplements and cutting out caffeine  Plan: Magic mouthwash 16 ounces 2 teaspoons by mouth 4 times a day swish do not swallow until tongue inflammation improves. Depo-Medrol 80 mg IM for nasal congestion B12 level was checked it [facility] and was normal. Reassure patient. He is continuing with GE reflux medication but doesn't feel is helping. He has been a Weight Watchers and has lost some weight. Denies being under excessive stress the semester. Long-standing history of anxiety. If oral symptoms do not improve, he can check with his dentist or back to ENT physician.  25 minutes spent with patient

## 2014-01-18 NOTE — Patient Instructions (Addendum)
Use ProctoCream-HC 4 times daily in rectal area. Take Levaquin 500 milligrams daily for 10 days. Warm hot compresses to left axilla 20 minutes twice daily. Return in one week.

## 2014-01-18 NOTE — Progress Notes (Signed)
   Subjective:    Patient ID: Brad Holland, male    DOB: 09-Oct-1975, 38 y.o.   MRN: [id number]  HPI  Here today to followup on carbuncle left axilla. New problem today includes respiratory infection and hoarseness. Last visit was placed on Levaquin 500 milligrams daily for 10 days. Carbuncle has resolved and he's feeling much better with that regard. Also was prescribed Proctofoam for hemorrhoids and anal fissure at last visit. Feels it is hoarseness may be related to a recent respiratory infection. Does not have sore throat. History of anxiety.    Review of Systems     Objective:   Physical Exam  Sounds hoarse when he speaks. TMs are clear. Neck is supple. Pharynx is clear. Chest clear.      Assessment & Plan:  Acute URI  Plan: Levaquin 500 milligrams daily for 7 days. Call if not better in 7-10 days.

## 2014-01-18 NOTE — Patient Instructions (Signed)
Use Magic mouthwash 2 teaspoons by mouth 4 times a day swish do not swallow. Stop brushing tongue.

## 2014-01-18 NOTE — Progress Notes (Signed)
   Subjective:    Patient ID: Brad Holland, male    DOB: 1976-02-01, 38 y.o.   MRN: [id number]  HPI Patient in today with nodule left axilla. Is not suitable to incise and drain. Also complaining of issues with hemorrhoids/rectal irritation. Noticed a lump in left axilla 3 days ago. Tender to touch. Some redness.    Review of Systems     Objective:   Physical Exam Nickel sized nodule left  axilla tender to touch with erythema. Nondraining. Small fissure rectal area.       Assessment & Plan:  Carbuncle left axilla-treated with antibiotics. Does not need I&D at this point  Anal fissure  Plan: Levaquin 500 milligrams daily for 10 days. Return in 7-10 days for followup. Warm hot compresses to left axilla for 20 minutes twice daily. Proctofoam HC to rectal area 4 times daily. 25 minutes spent with patient with these issues

## 2014-02-02 ENCOUNTER — Ambulatory Visit (INDEPENDENT_AMBULATORY_CARE_PROVIDER_SITE_OTHER): Payer: BC Managed Care – PPO | Admitting: Internal Medicine

## 2014-02-02 ENCOUNTER — Encounter: Payer: Self-pay | Admitting: Internal Medicine

## 2014-02-02 VITALS — BP 118/80 | HR 72 | Temp 98.4°F | Wt 169.5 lb

## 2014-02-02 DIAGNOSIS — H04129 Dry eye syndrome of unspecified lacrimal gland: Secondary | ICD-10-CM

## 2014-02-02 DIAGNOSIS — K117 Disturbances of salivary secretion: Secondary | ICD-10-CM

## 2014-02-02 DIAGNOSIS — H04123 Dry eye syndrome of bilateral lacrimal glands: Secondary | ICD-10-CM

## 2014-02-02 DIAGNOSIS — R682 Dry mouth, unspecified: Secondary | ICD-10-CM

## 2014-02-02 LAB — SEDIMENTATION RATE: Sed Rate: 1 mm/hr (ref 0–16)

## 2014-02-02 MED ORDER — FLUCONAZOLE 150 MG PO TABS: 150.0000 mg | ORAL_TABLET | Freq: Once | ORAL | Status: DC

## 2014-02-02 NOTE — Patient Instructions (Addendum)
Take Diflucan daily x 7 days. SS-a and SS-B antibodies drawn for Sjogren's syndrome. Consider allergy testing.

## 2014-02-02 NOTE — Progress Notes (Signed)
   Subjective:    Patient ID: Brad Holland, male    DOB: 1976/09/04, 38 y.o.   MRN: [id number]  HPI Says throat and mouth still feel dry. At last visit, Magic mouthwash was prescribed for him. Did not see any difference with Magic mouthwash. He is concerned about a chronic yeast infection. Depo-Medrol did not help him very much but maybe a little bit for short period of time. He says he got a headache from it. I suppose Sjogren syndrome is a possibility. We're doing test for that today. He has stopped brushing his tongue.    Review of Systems     Objective:   Physical Exam  No evidence of Candida on buccal mucosa. His tongue is a bit furrowed. Thick white mucous/saliva on his tongue. Pharynx is within normal limits. It is not red or injected. Brad Holland appears to be normal to me. He has boggy nasal mucosa right nostril and someone on the left as well.      Assessment & Plan:  Dry mouth but not dry eyes. He's tried Biotene doesn't like it. Recommended sugarless come or sugarless candy  Have drawn an SSA and SSB to check for Sjogren syndrome. He may want to consider allergy testing. Will go ahead and treat him for possible yeast infection with Diflucan 150 mg daily for 7 days. If no relief with this regimen, we may need to refer him to another ENT physician or allergist. If he has evidence of Sjogren syndrome, we can refer him to rheumatologist. He does take medications that cause dry mouth such as Lexapro and Klonopin.

## 2014-02-03 LAB — SJOGREN'S SYNDROME ANTIBODS(SSA + SSB)
SSA (RO) (ENA) ANTIBODY, IGG: NEGATIVE
SSB (La) (ENA) Antibody, IgG: <1

## 2014-02-14 ENCOUNTER — Telehealth: Payer: Self-pay | Admitting: Internal Medicine

## 2014-02-14 NOTE — Telephone Encounter (Signed)
Recommend Dr. Warren Danes.

## 2014-02-14 NOTE — Telephone Encounter (Signed)
Patient informed. 

## 2014-02-15 ENCOUNTER — Telehealth: Payer: Self-pay

## 2014-02-15 NOTE — Telephone Encounter (Signed)
Patient scheduled for a consultation with Dr. Naida Sleight, oral surgeon, on 02/21/2014 at 10:45 am. Notes faxed over and patient is aware of this date and time.

## 2014-02-27 ENCOUNTER — Telehealth: Payer: Self-pay

## 2014-02-27 DIAGNOSIS — R682 Dry mouth, unspecified: Secondary | ICD-10-CM

## 2014-02-27 DIAGNOSIS — K219 Gastro-esophageal reflux disease without esophagitis: Secondary | ICD-10-CM

## 2014-02-27 NOTE — Telephone Encounter (Signed)
This referral is being done to rule out GERD being the cause of his dry mouth, after seeing Dr. Warren Danes. He is agreeable to this plan.

## 2014-04-06 ENCOUNTER — Ambulatory Visit (INDEPENDENT_AMBULATORY_CARE_PROVIDER_SITE_OTHER): Payer: BC Managed Care – PPO | Admitting: Internal Medicine

## 2014-04-06 ENCOUNTER — Ambulatory Visit
Admission: RE | Admit: 2014-04-06 | Discharge: 2014-04-06 | Disposition: A | Discharge status: Home / Self Care | Payer: BC Managed Care – PPO | Source: Ambulatory Visit | Attending: Internal Medicine | Admitting: Internal Medicine

## 2014-04-06 VITALS — BP 120/78 | Temp 97.5°F | Wt 173.0 lb

## 2014-04-06 DIAGNOSIS — M25531 Pain in right wrist: Secondary | ICD-10-CM

## 2014-04-06 DIAGNOSIS — M65849 Other synovitis and tenosynovitis, unspecified hand: Secondary | ICD-10-CM

## 2014-04-06 DIAGNOSIS — M778 Other enthesopathies, not elsewhere classified: Secondary | ICD-10-CM

## 2014-04-06 DIAGNOSIS — M65839 Other synovitis and tenosynovitis, unspecified forearm: Secondary | ICD-10-CM

## 2014-04-06 DIAGNOSIS — M25539 Pain in unspecified wrist: Secondary | ICD-10-CM

## 2014-04-06 MED ORDER — MELOXICAM 15 MG PO TABS: 15.0000 mg | ORAL_TABLET | Freq: Every day | ORAL | Status: DC

## 2014-04-06 NOTE — Patient Instructions (Signed)
Have x-ray of right wrist. Take Mobic 15 mg daily for 2-4 weeks. Ice wrist down for 20 minutes twice daily. Avoid heavy lifting. Call if not better in 4 weeks or sooner if worse.

## 2014-04-06 NOTE — Progress Notes (Signed)
   Subjective:    Patient ID: Brad Holland, male    DOB: 23-Nov-1975, 38 y.o.   MRN: [id number]  HPI  Has been lifting weights recently. Has developed pain in right wrist. History of anxiety. Worries a lot about health issues.    Review of Systems     Objective:   Physical Exam  Full range of motion right wrist with normal muscle strength in wrist and hand. Deep tendon reflexes in the right upper extremity 2+ and symmetrical. Minimal wrist tenderness to palpation.     Assessment & Plan:  Right wrist tendinitis  Plan: Take Mobic 15 mg daily for approximately 2-4 weeks. No heavy lifting for 2 weeks. Apply ice to wrist 20 minutes once or twice daily. Call if not better in 4 weeks.

## 2014-05-22 ENCOUNTER — Encounter: Payer: Self-pay | Admitting: Gastroenterology

## 2014-06-10 ENCOUNTER — Encounter: Payer: Self-pay | Admitting: Internal Medicine

## 2014-07-21 ENCOUNTER — Encounter: Payer: Self-pay | Admitting: Gastroenterology

## 2014-07-21 ENCOUNTER — Ambulatory Visit (INDEPENDENT_AMBULATORY_CARE_PROVIDER_SITE_OTHER): Payer: BC Managed Care – PPO | Admitting: Gastroenterology

## 2014-07-21 VITALS — BP 100/64 | HR 76 | Ht 68.0 in | Wt 175.0 lb

## 2014-07-21 DIAGNOSIS — R490 Dysphonia: Secondary | ICD-10-CM | POA: Insufficient documentation

## 2014-07-21 DIAGNOSIS — R1310 Dysphagia, unspecified: Secondary | ICD-10-CM

## 2014-07-21 NOTE — Progress Notes (Signed)
_                                                                                                                History of Present Illness:  [name] is a pleasant 38 year old ale referred for evaluation of hoarseness and sore throat.  This has been an ongoing problem for over a year.  He is frequent symptoms of both.  When this occurs she feels like there is a foreign object or in the back of his throat or feels a sense of irritation with swallowing although he does not have dysphagia, per se.  He was told 15 years ago that he has "silent reflux".  He was treated with various acid blockers without improvement.  He's been evaluated by ENT or use SILENT acid reflux as well.  He is on no gastric irritants.  He denies coughing or pyrosis.   Past Medical History  Diagnosis Date  . Anxiety   . Depression   . Migraine headache   . Anal fissure    Past Surgical History  Procedure Laterality Date  . Tonsillectomy     family history includes Bladder Cancer in his father; Irritable bowel syndrome in his mother. Current Outpatient Prescriptions  Medication Sig Dispense Refill  . B Complex Vitamins (VITAMIN-B COMPLEX) TABS Take by mouth.      . Cholecalciferol (VITAMIN D-1000 MAX ST) 1000 UNITS tablet Take by mouth.      . clonazePAM (KLONOPIN) 0.5 MG tablet       . escitalopram (LEXAPRO) 10 MG tablet       . ibuprofen (ADVIL,MOTRIN) 200 MG tablet Take 200 mg by mouth every 6 (six) hours as needed.        . Magnesium 250 MG TABS Take by mouth.      . Multiple Vitamin (MULTIVITAMIN) tablet Take 1 tablet by mouth daily.        . rizatriptan (MAXALT) 10 MG tablet Take 1 tablet (10 mg total) by mouth as needed. May repeat in 2 hours if needed  10 tablet  3   No current facility-administered medications for this visit.   Allergies as of 07/21/2014  . (No Known Allergies)    reports that he quit smoking about 5 years ago. His smoking use included Cigarettes. He smoked 0.00 packs  per day. He has never used smokeless tobacco. He reports that he does not drink alcohol or use illicit drugs.   Review of Systems: Pertinent positive and negative review of systems were noted in the above HPI section. All other review of systems were otherwise negative.  Vital signs were reviewed in today's medical record Physical Exam: General: Well developed , well nourished, no acute distress Skin: anicteric Head: Normocephalic and atraumatic Eyes:  sclerae anicteric, EOMI Ears: Normal auditory acuity Mouth: No deformity or lesions Neck: Supple, no masses or thyromegaly Lungs: Clear throughout to auscultation Heart: Regular rate and rhythm; no murmurs, rubs or bruits Abdomen: Soft, non tender and non distended. No masses, hepatosplenomegaly or  hernias noted. Normal Bowel sounds Rectal:deferred Musculoskeletal: Symmetrical with no gross deformities  Skin: No lesions on visible extremities Pulses:  Normal pulses noted Extremities: No clubbing, cyanosis, edema or deformities noted Neurological: Alert oriented x 4, grossly nonfocal Cervical Nodes:  No significant cervical adenopathy Inguinal Nodes: No significant inguinal adenopathy Psychological:  Alert and cooperative. Normal mood and affect  See Assessment and Plan under Problem List

## 2014-07-21 NOTE — Patient Instructions (Signed)
[facility]2014 Separate instructions have been given

## 2014-07-21 NOTE — Assessment & Plan Note (Signed)
While chronic hoarseness and sore throat could be due to silent acid reflux lack of response mitigates against this.  Eosinophilic esophagitis, esophageal rings are other considerations.  Recommendations #1 upper endoscopy with dilation as indicated.  No abnormalities are seen I will place a bravo pH probe

## 2014-07-24 ENCOUNTER — Encounter (HOSPITAL_COMMUNITY): Payer: Self-pay | Admitting: *Deleted

## 2014-07-26 ENCOUNTER — Ambulatory Visit: Payer: BC Managed Care – PPO | Admitting: Internal Medicine

## 2014-07-27 ENCOUNTER — Ambulatory Visit: Payer: BC Managed Care – PPO | Admitting: Internal Medicine

## 2014-08-02 ENCOUNTER — Ambulatory Visit (INDEPENDENT_AMBULATORY_CARE_PROVIDER_SITE_OTHER): Payer: BC Managed Care – PPO | Admitting: Internal Medicine

## 2014-08-02 DIAGNOSIS — Z23 Encounter for immunization: Secondary | ICD-10-CM

## 2014-08-02 NOTE — Progress Notes (Signed)
Patient ID: Brad Holland, male   DOB: 1976-07-23, 38 y.o.   MRN: [id number] Patient received flu vaccine today.  He is aware of risk and it containing egg products.  He says he only gets headaches when exposed.  Patient agreed to injection today.

## 2014-08-07 ENCOUNTER — Ambulatory Visit (HOSPITAL_COMMUNITY): Payer: BC Managed Care – PPO | Admitting: Anesthesiology

## 2014-08-07 ENCOUNTER — Ambulatory Visit (HOSPITAL_COMMUNITY)
Admission: RE | Admit: 2014-08-07 | Discharge: 2014-08-07 | Disposition: A | Discharge status: Home / Self Care | Payer: BC Managed Care – PPO | Source: Ambulatory Visit | Attending: Gastroenterology | Admitting: Gastroenterology

## 2014-08-07 ENCOUNTER — Encounter (HOSPITAL_COMMUNITY)
Admission: RE | Disposition: A | Discharge status: Home / Self Care | Payer: Self-pay | Source: Ambulatory Visit | Attending: Gastroenterology

## 2014-08-07 ENCOUNTER — Encounter (HOSPITAL_COMMUNITY): Payer: Self-pay | Admitting: *Deleted

## 2014-08-07 DIAGNOSIS — R490 Dysphonia: Secondary | ICD-10-CM | POA: Insufficient documentation

## 2014-08-07 DIAGNOSIS — G43909 Migraine, unspecified, not intractable, without status migrainosus: Secondary | ICD-10-CM | POA: Insufficient documentation

## 2014-08-07 DIAGNOSIS — J029 Acute pharyngitis, unspecified: Secondary | ICD-10-CM | POA: Diagnosis not present

## 2014-08-07 DIAGNOSIS — F419 Anxiety disorder, unspecified: Secondary | ICD-10-CM | POA: Diagnosis not present

## 2014-08-07 DIAGNOSIS — R1310 Dysphagia, unspecified: Secondary | ICD-10-CM

## 2014-08-07 DIAGNOSIS — F329 Major depressive disorder, single episode, unspecified: Secondary | ICD-10-CM | POA: Diagnosis not present

## 2014-08-07 HISTORY — PX: ESOPHAGOGASTRODUODENOSCOPY (EGD) WITH PROPOFOL: SHX5813

## 2014-08-07 HISTORY — PX: BRAVO PH STUDY: SHX5421

## 2014-08-07 HISTORY — DX: Other complications of anesthesia, initial encounter: T88.59XA

## 2014-08-07 HISTORY — DX: Adverse effect of unspecified anesthetic, initial encounter: T41.45XA

## 2014-08-07 SURGERY — ESOPHAGOGASTRODUODENOSCOPY (EGD) WITH PROPOFOL
Anesthesia: Monitor Anesthesia Care

## 2014-08-07 MED ORDER — PROPOFOL 10 MG/ML IV BOLUS
INTRAVENOUS | Status: AC
  Filled 2014-08-07: qty 20

## 2014-08-07 MED ORDER — LACTATED RINGERS IV SOLN
INTRAVENOUS | Status: DC
  Administered 2014-08-07: 1000 mL via INTRAVENOUS

## 2014-08-07 MED ORDER — ONDANSETRON HCL 4 MG/2ML IJ SOLN
INTRAMUSCULAR | Status: AC
  Filled 2014-08-07: qty 2

## 2014-08-07 MED ORDER — PROPOFOL 10 MG/ML IV BOLUS
INTRAVENOUS | Status: DC | PRN
  Administered 2014-08-07: 20 mg via INTRAVENOUS
  Administered 2014-08-07 (×3): 30 mg via INTRAVENOUS
  Administered 2014-08-07 (×2): 20 mg via INTRAVENOUS

## 2014-08-07 MED ORDER — ONDANSETRON HCL 4 MG/2ML IJ SOLN
INTRAMUSCULAR | Status: DC | PRN
  Administered 2014-08-07: 4 mg via INTRAVENOUS

## 2014-08-07 MED ORDER — SODIUM CHLORIDE 0.9 % IV SOLN: INTRAVENOUS | Status: DC

## 2014-08-07 SURGICAL SUPPLY — 15 items

## 2014-08-07 NOTE — Op Note (Signed)
[facility] [address] [place], [id number]   ENDOSCOPY PROCEDURE REPORT  PATIENT: Brad Holland, Brad Holland  MR#: [id number] BIRTHDATE: 29-May-1976 , 38  yrs. old GENDER: male ENDOSCOPIST: Louis Meckelobert D Kaplan, MD REFERRED BY:  Sharlet SalinaMary John Baxley, M.D. PROCEDURE DATE:  08/07/2014 PROCEDURE:  EGD w/ Bravo capsule placement ASA CLASS:     Class II INDICATIONS:  hoarseness and foreign body sensation in esophagus. MEDICATIONS: Monitored anesthesia care TOPICAL ANESTHETIC:  DESCRIPTION OF PROCEDURE: After the risks benefits and alternatives of the procedure were thoroughly explained, informed consent was obtained.  The Pentax Gastroscope [id number] endoscope was introduced through the mouth and advanced to the second portion of the duodenum , Without limitations.  The instrument was slowly withdrawn as the mucosa was fully examined.      EXAM: The esophagus and gastroesophageal junction were completely normal in appearance.  The stomach was entered and closely examined.The antrum, angularis, and lesser curvature were well visualized, including a retroflexed view of the cardia and fundus. The stomach wall was normally distensable.  The scope passed easily through the pylorus into the duodenum.  Retroflexed views revealed no abnormalities.     The scope was then withdrawn from the patient and the procedure completed.  The GE junction was located 40 cm from the incisors.  A bravo pH probe was inserted at 34 cm from the incisors.  COMPLICATIONS: There were no immediate complications.  ENDOSCOPIC IMPRESSION: Normal appearing esophagus and GE junction, the stomach was well visualized and normal in appearance, normal appearing duodenum status post bravo pH probe placement  RECOMMENDATIONS: await pH probe results Office visit 2-3 weeks  REPEAT EXAM:  eSigned:  Louis Meckelobert D Kaplan, MD 08/07/2014 11:53 AM    CC:

## 2014-08-07 NOTE — Anesthesia Postprocedure Evaluation (Signed)
  Anesthesia Post-op Note  Patient: Brad Holland  Procedure(s) Performed: Procedure(s) (LRB): ESOPHAGOGASTRODUODENOSCOPY (EGD) WITH PROPOFOL (N/A) BRAVO PH STUDY (N/A)  Patient Location: PACU  Anesthesia Type: MAC  Level of Consciousness: awake and alert   Airway and Oxygen Therapy: Patient Spontanous Breathing  Post-op Pain: mild  Post-op Assessment: Post-op Vital signs reviewed, Patient's Cardiovascular Status Stable, Respiratory Function Stable, Patent Airway and No signs of Nausea or vomiting  Last Vitals:  Filed Vitals:   08/07/14 1200  BP: 132/64  Pulse: 62  Temp:   Resp: 12    Post-op Vital Signs: stable   Complications: No apparent anesthesia complications

## 2014-08-07 NOTE — Transfer of Care (Signed)
Immediate Anesthesia Transfer of Care Note  Patient: Brad Holland  Procedure(s) Performed: Procedure(s): ESOPHAGOGASTRODUODENOSCOPY (EGD) WITH PROPOFOL (N/A) BRAVO PH STUDY (N/A)  Patient Location: PACU and Endoscopy Unit  Anesthesia Type:MAC  Level of Consciousness: awake, alert  and oriented  Airway & Oxygen Therapy: Patient Spontanous Breathing and Patient connected to nasal cannula oxygen  Post-op Assessment: Report given to PACU RN, Post -op Vital signs reviewed and stable and Patient moving all extremities X 4  Post vital signs: Reviewed and stable  Complications: No apparent anesthesia complications

## 2014-08-07 NOTE — Interval H&P Note (Signed)
History and Physical Interval Note:  08/07/2014 11:28 AM  Brad StadeAnthony J Romulus  has presented today for surgery, with the diagnosis of dysphagia  The various methods of treatment have been discussed with the patient and family. After consideration of risks, benefits and other options for treatment, the patient has consented to  Procedure(s): ESOPHAGOGASTRODUODENOSCOPY (EGD) WITH PROPOFOL (N/A) BRAVO PH STUDY (N/A) as a surgical intervention .  The patient's history has been reviewed, patient examined, no change in status, stable for surgery.  I have reviewed the patient's chart and labs.  Questions were answered to the patient's satisfaction.    The recent H&P (dated **07/21/14 *) was reviewed, the patient was examined and there is no change in the patients condition since that H&P was completed.   [name] [name]  08/07/2014, 11:28 AM    [name] [name]

## 2014-08-07 NOTE — Anesthesia Preprocedure Evaluation (Signed)
Anesthesia Evaluation  Patient identified by MRN, date of birth, ID band Patient awake    Reviewed: Allergy & Precautions, H&P , NPO status , Patient's Chart, lab work & pertinent test results  Airway Mallampati: II  TM Distance: >3 FB Neck ROM: full    Dental no notable dental hx. (+) Teeth Intact, Dental Advisory Given   Pulmonary neg pulmonary ROS, former smoker,  Chronic hoarseness breath sounds clear to auscultation  Pulmonary exam normal       Cardiovascular Exercise Tolerance: Good negative cardio ROS  Rhythm:regular Rate:Normal  LVH   Neuro/Psych negative neurological ROS  negative psych ROS   GI/Hepatic negative GI ROS, Neg liver ROS,   Endo/Other  negative endocrine ROS  Renal/GU negative Renal ROS  negative genitourinary   Musculoskeletal   Abdominal   Peds  Hematology negative hematology ROS (+)   Anesthesia Other Findings   Reproductive/Obstetrics negative OB ROS                             Anesthesia Physical Anesthesia Plan  ASA: I  Anesthesia Plan: MAC   Post-op Pain Management:    Induction:   Airway Management Planned:   Additional Equipment:   Intra-op Plan:   Post-operative Plan:   Informed Consent: I have reviewed the patients History and Physical, chart, labs and discussed the procedure including the risks, benefits and alternatives for the proposed anesthesia with the patient or authorized representative who has indicated his/her understanding and acceptance.   Dental Advisory Given  Plan Discussed with: CRNA and Surgeon  Anesthesia Plan Comments:         Anesthesia Quick Evaluation

## 2014-08-07 NOTE — Discharge Instructions (Signed)
Esophagogastroduodenoscopy °Care After °Refer to this sheet in the next few weeks. These instructions provide you with information on caring for yourself after your procedure. Your caregiver may also give you more specific instructions. Your treatment has been planned according to current medical practices, but problems sometimes occur. Call your caregiver if you have any problems or questions after your procedure.  °HOME CARE INSTRUCTIONS °· Do not eat or drink anything until the numbing medicine (local anesthetic) has worn off and your gag reflex has returned. You will know that the local anesthetic has worn off when you can swallow comfortably. °· Do not drive for 12 hours after the procedure or as directed by your caregiver. °· Only take medicines as directed by your caregiver. °SEEK MEDICAL CARE IF:  °· You cannot stop coughing. °· You are not urinating at all or less than usual. °SEEK IMMEDIATE MEDICAL CARE IF: °· You have difficulty swallowing. °· You cannot eat or drink. °· You have worsening throat or chest pain. °· You have dizziness, lightheadedness, or you faint. °· You have nausea or vomiting. °· You have chills. °· You have a fever. °· You have severe abdominal pain. °· You have black, tarry, or bloody stools. °Document Released: 08/25/2012 Document Reviewed: 08/25/2012 °ExitCare® Patient Information ©2015 [employer]. This information is not intended to replace advice given to you by your health care provider. Make sure you discuss any questions you have with your health care provider. ° ° °Conscious Sedation, Adult, Care After °Refer to this sheet in the next few weeks. These instructions provide you with information on caring for yourself after your procedure. Your health care provider may also give you more specific instructions. Your treatment has been planned according to current medical practices, but problems sometimes occur. Call your health care provider if you have any problems or  questions after your procedure. °WHAT TO EXPECT AFTER THE PROCEDURE  °After your procedure: °· You may feel sleepy, clumsy, and have poor balance for several hours. °· Vomiting may occur if you eat too soon after the procedure. °HOME CARE INSTRUCTIONS °· Do not participate in any activities where you could become injured for at least 24 hours. Do not: °¨ Drive. °¨ Swim. °¨ Ride a bicycle. °¨ Operate heavy machinery. °¨ Cook. °¨ Use power tools. °¨ Climb ladders. °¨ Work from a high place. °· Do not make important decisions or sign legal documents until you are improved. °· If you vomit, drink water, juice, or soup when you can drink without vomiting. Make sure you have little or no nausea before eating solid foods. °· Only take over-the-counter or prescription medicines for pain, discomfort, or fever as directed by your health care provider. °· Make sure you and your family fully understand everything about the medicines given to you, including what side effects may occur. °· You should not drink alcohol, take sleeping pills, or take medicines that cause drowsiness for at least 24 hours. °· If you smoke, do not smoke without supervision. °· If you are feeling better, you may resume normal activities 24 hours after you were sedated. °· Keep all appointments with your health care provider. °SEEK MEDICAL CARE IF: °· Your skin is pale or bluish in color. °· You continue to feel nauseous or vomit. °· Your pain is getting worse and is not helped by medicine. °· You have bleeding or swelling. °· You are still sleepy or feeling clumsy after 24 hours. °SEEK IMMEDIATE MEDICAL CARE IF: °· You develop a   rash. °· You have difficulty breathing. °· You develop any type of allergic problem. °· You have a fever. °MAKE SURE YOU: °· Understand these instructions. °· Will watch your condition. °· Will get help right away if you are not doing well or get worse. °Document Released: 06/29/2013 Document Reviewed: 06/29/2013 °ExitCare®  Patient Information ©2015 [employer]. This information is not intended to replace advice given to you by your health care provider. Make sure you discuss any questions you have with your health care provider. ° °

## 2014-08-07 NOTE — H&P (View-Only) (Signed)
_                                                                                                                History of Present Illness:  [name] is a pleasant 38 year old ale referred for evaluation of hoarseness and sore throat.  This has been an ongoing problem for over a year.  He is frequent symptoms of both.  When this occurs she feels like there is a foreign object or in the back of his throat or feels a sense of irritation with swallowing although he does not have dysphagia, per se.  He was told 15 years ago that he has "silent reflux".  He was treated with various acid blockers without improvement.  He's been evaluated by ENT or use SILENT acid reflux as well.  He is on no gastric irritants.  He denies coughing or pyrosis.   Past Medical History  Diagnosis Date  . Anxiety   . Depression   . Migraine headache   . Anal fissure    Past Surgical History  Procedure Laterality Date  . Tonsillectomy     family history includes Bladder Cancer in his father; Irritable bowel syndrome in his mother. Current Outpatient Prescriptions  Medication Sig Dispense Refill  . B Complex Vitamins (VITAMIN-B COMPLEX) TABS Take by mouth.      . Cholecalciferol (VITAMIN D-1000 MAX ST) 1000 UNITS tablet Take by mouth.      . clonazePAM (KLONOPIN) 0.5 MG tablet       . escitalopram (LEXAPRO) 10 MG tablet       . ibuprofen (ADVIL,MOTRIN) 200 MG tablet Take 200 mg by mouth every 6 (six) hours as needed.        . Magnesium 250 MG TABS Take by mouth.      . Multiple Vitamin (MULTIVITAMIN) tablet Take 1 tablet by mouth daily.        . rizatriptan (MAXALT) 10 MG tablet Take 1 tablet (10 mg total) by mouth as needed. May repeat in 2 hours if needed  10 tablet  3   No current facility-administered medications for this visit.   Allergies as of 07/21/2014  . (No Known Allergies)    reports that he quit smoking about 5 years ago. His smoking use included Cigarettes. He smoked 0.00 packs  per day. He has never used smokeless tobacco. He reports that he does not drink alcohol or use illicit drugs.   Review of Systems: Pertinent positive and negative review of systems were noted in the above HPI section. All other review of systems were otherwise negative.  Vital signs were reviewed in today's medical record Physical Exam: General: Well developed , well nourished, no acute distress Skin: anicteric Head: Normocephalic and atraumatic Eyes:  sclerae anicteric, EOMI Ears: Normal auditory acuity Mouth: No deformity or lesions Neck: Supple, no masses or thyromegaly Lungs: Clear throughout to auscultation Heart: Regular rate and rhythm; no murmurs, rubs or bruits Abdomen: Soft, non tender and non distended. No masses, hepatosplenomegaly or  hernias noted. Normal Bowel sounds Rectal:deferred Musculoskeletal: Symmetrical with no gross deformities  Skin: No lesions on visible extremities Pulses:  Normal pulses noted Extremities: No clubbing, cyanosis, edema or deformities noted Neurological: Alert oriented x 4, grossly nonfocal Cervical Nodes:  No significant cervical adenopathy Inguinal Nodes: No significant inguinal adenopathy Psychological:  Alert and cooperative. Normal mood and affect  See Assessment and Plan under Problem List

## 2014-08-08 ENCOUNTER — Encounter (HOSPITAL_COMMUNITY): Payer: Self-pay | Admitting: Gastroenterology

## 2014-08-09 ENCOUNTER — Telehealth: Payer: Self-pay

## 2014-08-09 ENCOUNTER — Other Ambulatory Visit: Payer: Self-pay | Admitting: Internal Medicine

## 2014-08-09 MED ORDER — RIZATRIPTAN BENZOATE 10 MG PO TABS: 10.0000 mg | ORAL_TABLET | ORAL | Status: DC | PRN

## 2014-08-09 NOTE — Telephone Encounter (Signed)
Patient called requesting a refill on his maxalt.

## 2014-08-09 NOTE — Progress Notes (Signed)
Refill Maxalt for one year.

## 2014-08-14 ENCOUNTER — Telehealth: Payer: Self-pay | Admitting: Gastroenterology

## 2014-08-14 NOTE — Telephone Encounter (Signed)
Please check and see whether the study was placed on my desk

## 2014-08-14 NOTE — Telephone Encounter (Signed)
Spoke with [facility] in [facility] and he states he has them and will give them to you again tomorrow.

## 2014-08-14 NOTE — Telephone Encounter (Signed)
No results in on this patient.

## 2014-08-14 NOTE — Telephone Encounter (Signed)
Probe was turned in on 08/09/14 per the patient. He is anxious to hear what was found. He is also concerned that he won't be a very good historian on what he had eaten or when, the further away from that date that we get.  He also said you can call him after hours or communicate by "MyChart"

## 2014-08-16 NOTE — Telephone Encounter (Signed)
Patient is calling again.

## 2014-08-22 ENCOUNTER — Telehealth: Payer: Self-pay | Admitting: Gastroenterology

## 2014-08-22 NOTE — Procedures (Signed)
[facility]                               PROCEDURE NOTE  NAME:Brad Holland, Brad Holland                       MRN:          [id number] DATE:08/22/2014                            DOB:          1975-11-14   A 48-hour BRAVO pH study was done when the patient was on no medications.  On day 1, the total duration of reflux was 24 minutes. The majority of episodes occurred postprandially.  Total DeMeester score was 14.2.  On day 2, duration of time and reflux was 24 minutes, percentage of time in reflux was 6.6.  Most episodes of reflux occurred in the upright position.  Total DeMeester score was 18.2.  There was little correlation between symptoms and times of reflux.  IMPRESSION:  Insignificant gastroesophageal reflux with little correlation with symptoms.    Barbette Hairobert D. Arlyce DiceKaplan, MD,FACG    RDK/MedQ  DD: 08/22/2014  DT: 08/22/2014  Job #: [id number]

## 2014-08-22 NOTE — Telephone Encounter (Signed)
I review the results of the 48 hour bravo pH study.  I explained that there was borderline significant acid reflux on only one of the 2 days.  This was in the absence of any medications.  The patient has frequent irritations but denies pyrosis, per se.  His main complaint is hoarseness and sore throat.  Question of non-acid reflux has been raised.  I will schedule a, tree with impedance plethysmography.  I also informed the patient that I did leave a message on his home phone about 4 days ago regarding the results.

## 2014-08-23 ENCOUNTER — Telehealth: Payer: Self-pay

## 2014-08-23 ENCOUNTER — Other Ambulatory Visit: Payer: Self-pay

## 2014-08-23 DIAGNOSIS — K219 Gastro-esophageal reflux disease without esophagitis: Secondary | ICD-10-CM

## 2014-08-23 NOTE — Telephone Encounter (Signed)
I have left message for the patient to call back 

## 2014-08-23 NOTE — Telephone Encounter (Signed)
Patient is agreeable to having an esophageal manometry with 24hr PH impedance plethysmography. Scheduled for 09/11/14 arrive at 12:15. Patient instructed NPO for 8 hours.

## 2014-08-24 ENCOUNTER — Encounter: Payer: Self-pay | Admitting: Gastroenterology

## 2014-08-25 DIAGNOSIS — K219 Gastro-esophageal reflux disease without esophagitis: Secondary | ICD-10-CM | POA: Insufficient documentation

## 2014-08-28 ENCOUNTER — Ambulatory Visit: Payer: BC Managed Care – PPO | Admitting: Gastroenterology

## 2014-09-05 ENCOUNTER — Telehealth: Payer: Self-pay | Admitting: Gastroenterology

## 2014-09-06 NOTE — Telephone Encounter (Signed)
Procedure cancelled at [facility]

## 2014-09-08 ENCOUNTER — Other Ambulatory Visit: Payer: Self-pay | Admitting: Otolaryngology

## 2014-09-08 DIAGNOSIS — J029 Acute pharyngitis, unspecified: Secondary | ICD-10-CM

## 2014-09-11 ENCOUNTER — Telehealth: Payer: Self-pay | Admitting: Gastroenterology

## 2014-09-11 ENCOUNTER — Encounter (HOSPITAL_COMMUNITY): Admission: RE | Payer: Self-pay | Source: Ambulatory Visit

## 2014-09-11 ENCOUNTER — Ambulatory Visit (HOSPITAL_COMMUNITY)
Admission: RE | Admit: 2014-09-11 | Payer: BC Managed Care – PPO | Source: Ambulatory Visit | Admitting: Gastroenterology

## 2014-09-11 SURGERY — MANOMETRY, ESOPHAGUS
Anesthesia: Choice | Laterality: Bilateral

## 2014-09-11 NOTE — Telephone Encounter (Signed)
Left message on answering machine, "returning call".

## 2014-09-11 NOTE — Telephone Encounter (Signed)
Patient had questions about the results of his bravo pH study and why the tests were ordered in the manner that they were.  I had an extensive conversation lasting over 25 minutes explaining the reasoning behind the tests and my interpretation . I indicated that I ordered an upper endoscopy to look for inflammatory changes in the esophagus that would likely reflect acid reflux.  In the absence of findings, a bravo pH probe would be placed to see whether we could document significant acid reflux.  The bravo pH study did not demonstrate significant acid reflux.  I suggested that he try antacids as needed for throat burning.  In a previous phone conversation, the patient mentioned that, if any more testing was to be done,he would prefer to do it before the end of the year, since he has paid his deductibles.  I explained that, in that case, I would proceed with a esophageal manometry and impedance plethysmography to determine whether he is having significant non-acid reflux, and to measure esophageal motility and the resting pressure of the LES.  The patient informed me that he got a second opinion from 2 different physicians.  One suggested trying other medicines for reflux.  The other physician suggested proceeding with esophageal manometry and impedance plethysmography.  I explained to the patient that these were the very tests that I thought we should do if he was still not feeling well.  The patient was very concerned about the expense of these tests.  I indicated that I will see what we can do to minimize any co-pays.  He was very grateful for this.  He also felt that I did not communicate the results, my assessment and plans for his care.  Again, during this conversation I went into full detail about the logic behind his care and also apologized for any miscommunication or lack of communication.  He was grateful for the time we spent talking today and for clarifications of these issues.  He has not decided  whether or not to proceed with esophageal manometry and impedance plethysmography.  I indicated that we would be happy to do these tests for him.  Should he decide to go elsewhere I requested that he convey the results to me so I can follow his clinical progress.

## 2014-09-18 ENCOUNTER — Ambulatory Visit
Admission: RE | Admit: 2014-09-18 | Discharge: 2014-09-18 | Disposition: A | Discharge status: Home / Self Care | Payer: BC Managed Care – PPO | Source: Ambulatory Visit | Attending: Otolaryngology | Admitting: Otolaryngology

## 2014-09-18 DIAGNOSIS — J029 Acute pharyngitis, unspecified: Secondary | ICD-10-CM

## 2014-09-18 MED ORDER — IOHEXOL 300 MG/ML  SOLN
75.0000 mL | Freq: Once | INTRAMUSCULAR | Status: AC | PRN
  Administered 2014-09-18: 75 mL via INTRAVENOUS

## 2014-09-25 ENCOUNTER — Telehealth: Payer: Self-pay | Admitting: Gastroenterology

## 2014-09-25 NOTE — Telephone Encounter (Signed)
The patient called me directly on my cell phone stating that the hospital would not discount or forgive any charges.  He requested that I speak to billing directly since the charges present  a financial hardship.

## 2014-11-14 ENCOUNTER — Ambulatory Visit (INDEPENDENT_AMBULATORY_CARE_PROVIDER_SITE_OTHER): Payer: BC Managed Care – PPO | Admitting: Internal Medicine

## 2014-11-14 ENCOUNTER — Encounter: Payer: Self-pay | Admitting: Internal Medicine

## 2014-11-14 VITALS — BP 118/70 | HR 70 | Temp 97.6°F | Wt 176.5 lb

## 2014-11-14 DIAGNOSIS — M2240 Chondromalacia patellae, unspecified knee: Secondary | ICD-10-CM

## 2014-11-14 DIAGNOSIS — N529 Male erectile dysfunction, unspecified: Secondary | ICD-10-CM

## 2014-11-14 NOTE — Progress Notes (Signed)
   Subjective:    Patient ID: Brad Holland, male    DOB: July 08, 1976, 39 y.o.   MRN: [id number]  HPI  Patient in today to discuss erectile dysfunction treatment. Says he's had some issues for some time. Thinks may be it's related to anxiety and now with increased age. Would like to try medication.  He recently saw orthopedist regarding what sounds like chondromalacia patella. He was not given any quadriceps strengthening exercises. We talked about this at length today. This might help him as well as icing his knee down and taking an anti-inflammatory medication. He continues to see voice specialist enrolling [facility] who is trying to retrain his voice. Today his voice is pretty good. Says it continues to come and go.  He had upper endoscopy by Dr. Arlyce DiceKaplan November 2015 which was normal.    Review of Systems     Objective:   Physical Exam  Not examined. Spent 20 minutes speaking with patient about these issues      Assessment & Plan:  Erectile dysfunction  Chondromalacia patella  Plan: Sample of Viagra 100 mg to take one half tablet one hour before intercourse. Prescription for 30 days of Cialis. He prefers to take medication before intercourse and not on a daily basis. May take 10-20 mg one hour before intercourse.

## 2014-11-14 NOTE — Patient Instructions (Signed)
Given samples for Viagra and offer for free Cialis tablets for 30 days. Patient to take one 1:30 100 mg tablet of Viagra one hour before intercourse or tender 20 mg of Cialis one hour before intercourse. Reviewed with him quadriceps strengthening exercises. May ice patella down for 20 minutes once or twice daily. May take anti-inflammatory for chondromalacia patella

## 2015-02-26 ENCOUNTER — Encounter: Payer: Self-pay | Admitting: Family Medicine

## 2015-02-26 ENCOUNTER — Ambulatory Visit (INDEPENDENT_AMBULATORY_CARE_PROVIDER_SITE_OTHER): Payer: BC Managed Care – PPO | Admitting: Family Medicine

## 2015-02-26 VITALS — BP 88/60 | HR 78 | Ht 68.0 in | Wt 170.0 lb

## 2015-02-26 DIAGNOSIS — G8929 Other chronic pain: Secondary | ICD-10-CM

## 2015-02-26 DIAGNOSIS — M25531 Pain in right wrist: Secondary | ICD-10-CM | POA: Diagnosis not present

## 2015-02-26 DIAGNOSIS — G5621 Lesion of ulnar nerve, right upper limb: Secondary | ICD-10-CM

## 2015-02-27 DIAGNOSIS — G8929 Other chronic pain: Secondary | ICD-10-CM | POA: Insufficient documentation

## 2015-02-27 DIAGNOSIS — G5620 Lesion of ulnar nerve, unspecified upper limb: Secondary | ICD-10-CM | POA: Insufficient documentation

## 2015-02-27 DIAGNOSIS — M25539 Pain in unspecified wrist: Secondary | ICD-10-CM

## 2015-02-27 NOTE — Progress Notes (Signed)
Patient ID: Brad Holland, male   DOB: 1975/10/31, 39 y.o.   MRN: [id number]  Brad Stadenthony J Holland - 39 y.o. male MRN [id number]  Date of birth: 1975/10/31    SUBJECTIVE:     Right wrist pain. About a year ago he was lifting weights and had some acute onset right wrist pain. He was doing bicep curls. He was able to continue using his wrist but had to stop lifting weights for several months because of pain. It eventually resolved and he was able to return to his regular activities. In the last couple of months he's noticed that he's now having wrist pain again, it is impacting his daily life. It hurts intermittently, but daily. Certain activities such as shaking someone's hand or picking up something heavy causes pain. He does not have pain with using a keyboard. He is right-hand dominant. He works as a Runner, broadcasting/film/videoteacher. He works out regularly and has some pain if he lifts weights or does planks.  #2. Also having some numbness in the fourth and fifth fingers of his right hand that seems to calm from his elbow downward. Says occasionally he'll have some upper arm symptoms but usually it is a numb tingly sensation from the elbow down into the lateral hand and fourth and fifth fingers. This is also very intermittent. This is not associated with pain just an uncomfortable feeling.  #3. Says he has a lot of chronic pain issues and that if he has an injury just seems to hang on. ROS:     No unusual weight change, fever, sweats, chills. Other than the paresthesias mentioned in history of present illness, he's not had any unusual sensations or loss of sensation in his extremities. He is noted no swelling, no redness, no stiffness of the right wrist joint. He's not had any problem with the dexterity of his fingers on either hand.  PERTINENT  PMH / PSH FH / / SH:  Past Medical, Surgical, Social, and Family History Reviewed & Updated in the EMR.  Pertinent findings include:  History of anxiety History of LVH per his problem  list. Looking through his chart I think this was related to someb orderline changes on EKG, he is echocardiogram did not show any LVH. Remote history of smoking  OBJECTIVE: BP 88/60 mmHg  Pulse 78  Ht 5\' 8"  (1.727 m)  Wt 170 lb (77.111 kg)  BMI 25.85 kg/m2  Physical Exam:  Vital signs are reviewed. GEN.: Well-developed male no acute distress WRISTS: Bilaterally symmetrical. He has full range of motion flexion extension, deviation to ulnar and radial side. Very slight snuff box tenderness to deep palpation. Wrist strength is normal. Finger abduction abduction and dexterity is normal. Forearm is normal. I can reproduce his pain to some extent if I have him do resisted extension. Elbow: Bilaterally elbows are symmetrical with full range of motion. There is no unusual tenderness to palpation in the right ulnar nerve area. No notable mass or deformity.  INJECTION: Patient was given informed consent, signed copy in the chart. Appropriate time out was taken. Area prepped and draped in usual sterile fashion. 1 cc of methylprednisolone 40 mg/ml plus  1 cc of 1% lidocaine without epinephrine was injected into the right radial carpal joint using a(n) ultrasound-guided dorsal  approach. The patient tolerated the procedure well. There were no complications. Post procedure instructions were given.   ASSESSMENT & PLAN:  See problem based charting & AVS for pt instructions.

## 2015-02-27 NOTE — Assessment & Plan Note (Signed)
His exam is pretty benign so they'll attack and think of that would cause this type of chronic problem would be a [facility] pathology. Really only way to evaluate that's doing MR arthrogram which she would like to avoid secondary to expense. We discussed other options. I reviewed his previous films. Ultimately he decided to try Quercus sterile injection into the radiocarpal joint which we did today under ultrasound guidance. I'll see him back in about 2 weeks and see how is doing.

## 2015-03-02 ENCOUNTER — Ambulatory Visit: Payer: BC Managed Care – PPO | Admitting: Sports Medicine

## 2015-04-06 ENCOUNTER — Encounter: Payer: Self-pay | Admitting: Internal Medicine

## 2015-04-06 ENCOUNTER — Ambulatory Visit (INDEPENDENT_AMBULATORY_CARE_PROVIDER_SITE_OTHER): Payer: BC Managed Care – PPO | Admitting: Internal Medicine

## 2015-04-06 VITALS — BP 108/70 | HR 68 | Temp 97.8°F | Wt 177.0 lb

## 2015-04-06 DIAGNOSIS — H6092 Unspecified otitis externa, left ear: Secondary | ICD-10-CM | POA: Diagnosis not present

## 2015-04-06 MED ORDER — CIPROFLOXACIN-HYDROCORTISONE 0.2-1 % OT SUSP: 3.0000 [drp] | Freq: Two times a day (BID) | OTIC | Status: DC

## 2015-04-06 MED ORDER — AMOXICILLIN 500 MG PO CAPS: 500.0000 mg | ORAL_CAPSULE | Freq: Three times a day (TID) | ORAL | Status: DC

## 2015-04-06 NOTE — Progress Notes (Signed)
   Subjective:    Patient ID: Brad Holland, male    DOB: 14-Feb-1976, 39 y.o.   MRN: [id number]  HPI  Has been spending a lot of time swimming in the pool recently with his son. He is enjoying himself the summer. [place] went to [place] earlier this summer. Has developed left ear pain. Thinks it may be related to swimming. Says left ear is sore. No URI symptoms. No fever or sore throat.    Review of Systems     Objective:   Physical Exam  Left external ear canal is erythematous and slightly swollen. Left TM is normal.       Assessment & Plan:  Acute left otitis externa  Plan: Cipro otic suspension to left ear 2-3 times daily. Amoxicillin 500 mg 3 times daily for 10 days. Stay out of the water 4-5 days. After ear infection has cleared, may use 1:1 combination of rubbing alcohol and white vinegar after swimming or purchase over-the-counter after swim drops.

## 2015-04-06 NOTE — Patient Instructions (Signed)
Cipro otic suspension 3 drops in left ear 2-3 times daily for 5-7 days. Amoxicillin 500 mg 3 times daily for 10 days. After ear infection clears, use combination of acetic acid and alcohol after swimming

## 2015-04-12 ENCOUNTER — Telehealth: Payer: Self-pay

## 2015-04-12 NOTE — Telephone Encounter (Signed)
Patient called with a question regarding his medication.  He is using ear drops for an infection.  He was wondering how long to use the drops.  Advised patient per last office note he is to use the ear drops for 5-7 days.  Patient understands.

## 2015-08-10 ENCOUNTER — Telehealth: Payer: Self-pay | Admitting: Internal Medicine

## 2015-08-10 ENCOUNTER — Other Ambulatory Visit: Payer: Self-pay

## 2015-08-10 MED ORDER — SILDENAFIL CITRATE 100 MG PO TABS
100.0000 mg | ORAL_TABLET | Freq: Every day | ORAL | Status: DC | PRN
Start: 2015-08-10 — End: 2016-12-15

## 2015-08-10 NOTE — Telephone Encounter (Signed)
States he has been trying various options of treatment for ED.  The Viagra worked the best with the least side effects.  He would like to have a Rx called in for Viagra please.    [facility]  Please call him when this has been done.  Thanks.

## 2015-08-10 NOTE — Telephone Encounter (Signed)
Patient notified and script sent to pharmacy 

## 2015-08-10 NOTE — Telephone Encounter (Signed)
Please refill Viagra 100 mg prn one year

## 2016-11-12 ENCOUNTER — Telehealth: Payer: Self-pay | Admitting: Internal Medicine

## 2016-11-12 NOTE — Telephone Encounter (Signed)
Patient came into the office today and dropped an order off to be signed by Dr. Lenord Holland from [facility].  Patient was in a rush and stated that he has been seeing this PT, Brad Holland for ankle pain and she is wanting to put this medication patch on him and he is wanting Dr. Lenord Holland to sign this "order" for the patch.    I spoke with Dr. Lenord Holland and she has not evaluated the patient for this condition.  She did not send the patient to PT for this condition.  And, patient has not been seen in our office since 04/06/2015 (for an ear infection).  Patient hasn't had a full physical since prior to 2012 by Dr. Lenord Holland.    Called patient back and advised that he would need to be seen by Dr. Lenord Holland and evaluated for this condition prior to her agreeing to sign anything.  Patient advised that he has an appointment tomorrow (11/13/16) at 10:00.  He wants to know if he can be seen prior to this in the morning.  I advised that I can get him in to see Dr. Lenord Holland on Thursday at 12:15, but that's the earliest thing I have available for tomorrow.  Patient declined appointment.  He stated he would come by and pick up the PT order that he dropped off.  I offered to make him another appointment.  Patient states that he'll make an appointment for some other time.

## 2016-12-15 ENCOUNTER — Encounter: Payer: Self-pay | Admitting: Internal Medicine

## 2016-12-15 ENCOUNTER — Ambulatory Visit (INDEPENDENT_AMBULATORY_CARE_PROVIDER_SITE_OTHER): Payer: BC Managed Care – PPO | Admitting: Internal Medicine

## 2016-12-15 VITALS — BP 110/80 | HR 74 | Temp 98.1°F | Ht 69.0 in | Wt 183.0 lb

## 2016-12-15 DIAGNOSIS — M791 Myalgia: Secondary | ICD-10-CM | POA: Diagnosis not present

## 2016-12-15 DIAGNOSIS — L03039 Cellulitis of unspecified toe: Secondary | ICD-10-CM

## 2016-12-15 DIAGNOSIS — M7918 Myalgia, other site: Secondary | ICD-10-CM | POA: Insufficient documentation

## 2016-12-15 MED ORDER — MELOXICAM 15 MG PO TABS: 15.0000 mg | ORAL_TABLET | Freq: Every day | ORAL | 3 refills | Status: DC

## 2016-12-15 MED ORDER — METHOCARBAMOL 500 MG PO TABS: 500.0000 mg | ORAL_TABLET | Freq: Three times a day (TID) | ORAL | 0 refills | Status: DC | PRN

## 2016-12-15 MED ORDER — DOXYCYCLINE HYCLATE 100 MG PO TABS: 100.0000 mg | ORAL_TABLET | Freq: Two times a day (BID) | ORAL | 0 refills | Status: DC

## 2016-12-15 NOTE — Patient Instructions (Addendum)
Mobic 15 mg daily. Robaxin 500 mg at bedtime and up to twice a day if needed for musculoskeletal pain. Doxycycline 100 mg twice daily for 10 days for saline as/paronychia left great toe. Soak toe in warm soapy water for 20 minutes twice daily. Tetanus immunization is up-to-date. Prescription signed for iontophoresis at physical therapy.

## 2016-12-15 NOTE — Progress Notes (Signed)
   Subjective:    Patient ID: Brad Holland, male    DOB: 31-Oct-1975, 41 y.o.   MRN: [id number]  HPI 41 year old Male in today with a red painful  great toe. Says he has a long-standing history of onychomycosis. Doesn't recall any injury to the cuticle.  Also having considerable musculoskeletal pain in his parathoracic muscles bilaterally and upper shoulders. He needs a prescription signed for iontophoresis to be done at physical therapy. He apparently has seen Dr. Corliss Skainseveshwar , rheumatologist, for musculoskeletal pain. I do not have those records. [place] in [place] at [facility].  Says at one point he was diagnosed with fibromyalgia syndrome. Says he exercises regularly.  He says that I had previously prescribed Flexeril for him but that caused him to have a morning headache. He's asking for another muscle relaxant. He also wants an anti-inflammatory medication. 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    Review of Systems see above     Objective:   Physical Exam Left great toe is erythematous distally particular around the cuticle area. No drainage noted at the present time. Says he has had purulent drainage from the cuticle area.       Assessment & Plan:  Cellulitis/paronychia left great toe  Onychomycosis-to see podiatrist  Musculoskeletal pain-prescribed Robaxin to take it bedtime up to twice daily if needed. Mobic 15 mg daily.  Plan: Doxycycline 100 mg twice daily for 10 days. Soak in warm soapy water for 20 minutes twice daily.

## 2016-12-30 ENCOUNTER — Telehealth: Payer: Self-pay | Admitting: Internal Medicine

## 2016-12-30 MED ORDER — METAXALONE 800 MG PO TABS: 800.0000 mg | ORAL_TABLET | Freq: Every day | ORAL | 0 refills | Status: DC

## 2016-12-30 NOTE — Telephone Encounter (Signed)
He said the flexeril was giving him headaches but he could try that. Skelaxin was sent to his pharmacy.

## 2016-12-30 NOTE — Telephone Encounter (Signed)
E-scribed, left message for pt to return call.

## 2016-12-30 NOTE — Telephone Encounter (Signed)
This is the one he wanted. He told ne that he could not take Flexeril. There are not many to choose from and he needs to know that. We can try Skelaxin one po qhs. #30 with no refill.

## 2016-12-30 NOTE — Telephone Encounter (Signed)
Patient states that the Methocarbamol is causing him to have insomnia and also causing headaches.  He wants to know if you could prescribe something else.  He realizes that most muscle relaxers will cause drowsiness.  And, he is okay with that.  He would rather have drowsiness than the insomnia.    Pharmacy:  [facility] number for contact:  [phone]  Thank you.

## 2017-01-08 ENCOUNTER — Encounter: Payer: Self-pay | Admitting: Podiatry

## 2017-01-08 ENCOUNTER — Ambulatory Visit (INDEPENDENT_AMBULATORY_CARE_PROVIDER_SITE_OTHER): Payer: BC Managed Care – PPO | Admitting: Podiatry

## 2017-01-08 VITALS — BP 123/78 | HR 60 | Resp 16

## 2017-01-08 DIAGNOSIS — L603 Nail dystrophy: Secondary | ICD-10-CM

## 2017-01-08 NOTE — Patient Instructions (Signed)

## 2017-01-08 NOTE — Progress Notes (Signed)
   Subjective:    Patient ID: Brad Holland, male    DOB: 20-Oct-1975, 41 y.o.   MRN: [id number]  HPI: He presents today with chief concern of a infection to the hallux nails bilaterally. States that he's had this problem for years with thick discolored toenails but has recently developed a skin infection along the proximal nail fold of the left hallux with drainage from beneath the nail. He states that he would just like to know his options area    Review of Systems  Musculoskeletal: Positive for arthralgias, back pain and myalgias.  Neurological: Positive for headaches.  All other systems reviewed and are negative.      Objective:   Physical Exam: Vital signs are stable he is alert and oriented 3. Pulses are palpable. Neurologic sensorium is intact. Deep tendon reflexes are intact. Muscle strength +5 over 5 dorsiflexion plantar flexors and inverters everters on his musculature is intact. Orthopedic evaluation of his rheumatologist as well as ankle range of motion without crepitation. Nail dystrophy with probable onychomycosis hallux bilateral left greater than right. Also has some infection to the fourth and fifth digits of the right foot as well. There appears to be some tinea pedis associated with this.        Assessment & Plan:  Probable nail dystrophy onychomycosis.  Plan: Samples of the nail were taken today to be sent for pathologic evaluation will notify him of the results in 1 month.

## 2017-02-03 ENCOUNTER — Telehealth: Payer: Self-pay | Admitting: *Deleted

## 2017-02-03 NOTE — Telephone Encounter (Signed)
Pt called for results. Dr. Al CorpusHyatt reviewed 01/08/2017 fungal culture results as +, pt needs an appt. Due to network difficulties the call could not be placed.

## 2017-02-04 ENCOUNTER — Encounter: Payer: Self-pay | Admitting: Podiatry

## 2017-02-04 NOTE — Telephone Encounter (Signed)
Pt called for results and stated when he was in last time he was told he could call for results due to copay being so high. Please call pt with results. His appt for 5.17.18 he is canceling

## 2017-02-05 ENCOUNTER — Ambulatory Visit: Payer: BC Managed Care – PPO | Admitting: Podiatry

## 2017-02-05 ENCOUNTER — Ambulatory Visit (INDEPENDENT_AMBULATORY_CARE_PROVIDER_SITE_OTHER): Payer: BC Managed Care – PPO | Admitting: Internal Medicine

## 2017-02-05 VITALS — BP 98/60 | HR 71 | Temp 97.8°F | Ht 69.0 in | Wt 182.0 lb

## 2017-02-05 DIAGNOSIS — M545 Low back pain: Secondary | ICD-10-CM

## 2017-02-05 DIAGNOSIS — R1030 Lower abdominal pain, unspecified: Secondary | ICD-10-CM

## 2017-02-05 LAB — POCT URINALYSIS DIPSTICK
Bilirubin, UA: NEGATIVE
Blood, UA: NEGATIVE
Glucose, UA: NEGATIVE
KETONES UA: NEGATIVE
LEUKOCYTES UA: NEGATIVE
Nitrite, UA: NEGATIVE
PH UA: 7.5 (ref 5.0–8.0)
PROTEIN UA: NEGATIVE
SPEC GRAV UA: 1.01 (ref 1.010–1.025)
UROBILINOGEN UA: 0.2 U/dL

## 2017-02-05 MED ORDER — MEPERIDINE HCL 50 MG PO TABS: 50.0000 mg | ORAL_TABLET | Freq: Four times a day (QID) | ORAL | 0 refills | Status: DC | PRN

## 2017-02-05 MED ORDER — IBUPROFEN 600 MG PO TABS: 600.0000 mg | ORAL_TABLET | Freq: Three times a day (TID) | ORAL | 2 refills | Status: DC | PRN

## 2017-02-05 NOTE — Progress Notes (Signed)
   Subjective:    Patient ID: Brad Holland, male    DOB: 08-27-1976, 41 y.o.   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 He is fluent in [place].  He's been having issues with low back pain and is concerned that he may have a kidney or prostate issue.   Has a history of anxiety and gets quite anxious about his health.. He has no urinary symptoms just some discomfort around his lower back and sometimes in his groin area bilaterally. His father apparently had a history of bladder cancer and frightened him when he talked about this.  He has a history of migraine headaches.  He goes to physical therapy for musculoskeletal pain but says this discomfort is a bit different.    Review of Systems see above     Objective:   Physical Exam He is tender bilaterally lower back. No lymphadenopathy in groin. Straight leg raising is negative at 90 bilaterally. Prostate exam is normal.       Assessment & Plan:  Musculoskeletal pain  History of migraine headaches  Anxiety  Plan: He was given prescription for Demerol to take every 6 hours if needed for back pain and discomfort tickly with airplane travel. Maxalt refilled. He is on Lexapro 20 mg daily and Klonopin for anxiety.

## 2017-02-08 ENCOUNTER — Other Ambulatory Visit: Payer: Self-pay | Admitting: Internal Medicine

## 2017-02-11 ENCOUNTER — Telehealth: Payer: Self-pay

## 2017-02-11 NOTE — Telephone Encounter (Signed)
Received fax from [facility] in regards to a refill on Maxalt for patient. Seems it was sent in as MLT and the patient has been on plain. Dr. Lenord FellersBaxley ok'd the change and faxed back to pharmacy on 01/21/17

## 2017-02-16 ENCOUNTER — Encounter: Payer: Self-pay | Admitting: Internal Medicine

## 2017-02-16 NOTE — Patient Instructions (Signed)
Small prescription of Demerol given for travel to [place]. He doesn't tolerate other pain medications well. Maxalt refill for migraine headaches. Patient reassured he does not have a prostate issue.

## 2017-03-23 ENCOUNTER — Telehealth: Payer: Self-pay

## 2017-03-23 DIAGNOSIS — R21 Rash and other nonspecific skin eruption: Secondary | ICD-10-CM

## 2017-03-23 MED ORDER — CLOTRIMAZOLE-BETAMETHASONE 1-0.05 % EX CREA: 1.0000 "application " | TOPICAL_CREAM | Freq: Two times a day (BID) | CUTANEOUS | 99 refills | Status: DC

## 2017-03-23 NOTE — Telephone Encounter (Signed)
Have e-prescribed this for him. Was not on med list.

## 2017-03-23 NOTE — Telephone Encounter (Signed)
Pt would like a refill on clotrimazole betamethasone cream he states that it is for rashes he gets from flying. Please advise

## 2017-05-20 ENCOUNTER — Other Ambulatory Visit: Payer: Self-pay | Admitting: Internal Medicine

## 2017-10-08 ENCOUNTER — Other Ambulatory Visit: Payer: Self-pay | Admitting: Internal Medicine

## 2017-11-05 ENCOUNTER — Encounter: Payer: Self-pay | Admitting: Internal Medicine

## 2017-11-05 ENCOUNTER — Telehealth: Payer: Self-pay

## 2017-11-05 ENCOUNTER — Ambulatory Visit (INDEPENDENT_AMBULATORY_CARE_PROVIDER_SITE_OTHER): Payer: BC Managed Care – PPO | Admitting: Internal Medicine

## 2017-11-05 VITALS — BP 104/80 | HR 80 | Temp 98.1°F | Ht 69.0 in | Wt 184.0 lb

## 2017-11-05 DIAGNOSIS — R6883 Chills (without fever): Secondary | ICD-10-CM

## 2017-11-05 DIAGNOSIS — R894 Abnormal immunological findings in specimens from other organs, systems and tissues: Secondary | ICD-10-CM | POA: Diagnosis not present

## 2017-11-05 NOTE — Telephone Encounter (Signed)
He needs to be seen today

## 2017-11-05 NOTE — Telephone Encounter (Signed)
Patient called states him and his wife and his 2 children were around some people that have the flu. Him and his wife are starting to have some symptoms (chills, upset stomach) and he would like to know if you can send in tamiflu for him? Or what would you suggest they do?  [phone] (M)

## 2017-11-05 NOTE — Telephone Encounter (Signed)
SCHEDULED

## 2017-11-15 NOTE — Progress Notes (Signed)
   Subjective:    Patient ID: Brad Holland, male    DOB: 1976/06/04, 42 y.o.   MRN: [id number]  HPI He has had some chills recently.  He is concerned about the flu.  Children have had flulike illnesses.  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  He has some malaise and fatigue.  No documented fever.    Review of Systems see above     Objective:   Physical Exam Temperature 98.1 degrees orally.  Neck is supple.  Pharynx is clear.  Chest clear to auscultation. Rapid flu test is negative      Assessment & Plan:  Patient was not placed on Tamiflu as his rapid flu test is negative.  He was reassured.  Call if symptoms worsen.

## 2017-11-15 NOTE — Patient Instructions (Signed)
Call if symptoms worsen.

## 2017-11-16 LAB — POCT INFLUENZA A/B
Influenza A, POC: NEGATIVE
Influenza B, POC: NEGATIVE

## 2017-11-26 ENCOUNTER — Telehealth: Payer: Self-pay | Admitting: *Deleted

## 2017-11-26 NOTE — Telephone Encounter (Signed)
Pt states Dr. Al CorpusHyatt had discuss topical and oral medications to treat his toenail fungus and he would like to know how to get started. Pt states he has a $80.00 Co-pay and would like to know if he could get started on the oral medication without having to come in for a consultation. I told pt, since LOV 12/2016 Dr. Al CorpusHyatt would want to discuss the oral medication therapy in person, that labs prior to beginning the medication would be ordered, and once reviewed he may be able to start the medication, and he could discuss with Dr. Al CorpusHyatt possibly getting the labs that are required 30 days into therapy drawn without having to be seen. Pt states understanding and is transferred to schedulers.

## 2017-12-10 ENCOUNTER — Ambulatory Visit: Payer: BC Managed Care – PPO | Admitting: Podiatry

## 2017-12-10 ENCOUNTER — Encounter: Payer: Self-pay | Admitting: Podiatry

## 2017-12-10 DIAGNOSIS — L603 Nail dystrophy: Secondary | ICD-10-CM | POA: Diagnosis not present

## 2017-12-10 DIAGNOSIS — Z79899 Other long term (current) drug therapy: Secondary | ICD-10-CM | POA: Diagnosis not present

## 2017-12-10 MED ORDER — TERBINAFINE HCL 250 MG PO TABS: 250.0000 mg | ORAL_TABLET | Freq: Every day | ORAL | 0 refills | Status: DC

## 2017-12-10 NOTE — Patient Instructions (Signed)

## 2017-12-10 NOTE — Progress Notes (Signed)
He presents today for a follow-up of his pathology report regarding his toenails.  Objective: Vital signs are stable he is alert and oriented x3 no change in his physical exam.  Pathology does demonstrate positive onychomycosis with T rubrum.  Assessment: Onychomycosis.  Plan: At this point we will get him started on laser therapy and Lamisil therapy.  We requested a liver profile and he will provide that for us.  We provided him with a requisition.  He should be scheduled with Shanda BumpsJessica in the next few days.

## 2017-12-15 ENCOUNTER — Ambulatory Visit: Payer: Self-pay

## 2017-12-15 DIAGNOSIS — B351 Tinea unguium: Secondary | ICD-10-CM

## 2017-12-15 DIAGNOSIS — L603 Nail dystrophy: Secondary | ICD-10-CM

## 2017-12-15 LAB — HEPATIC FUNCTION PANEL
AG Ratio: 1.8 (calc) (ref 1.0–2.5)
ALKALINE PHOSPHATASE (APISO): 41 U/L (ref 40–115)
ALT: 22 U/L (ref 9–46)
AST: 24 U/L (ref 10–40)
Albumin: 4.4 g/dL (ref 3.6–5.1)
BILIRUBIN TOTAL: 0.5 mg/dL (ref 0.2–1.2)
Bilirubin, Direct: 0.1 mg/dL (ref 0.0–0.2)
Globulin: 2.4 g/dL (calc) (ref 1.9–3.7)
Indirect Bilirubin: 0.4 mg/dL (calc) (ref 0.2–1.2)
TOTAL PROTEIN: 6.8 g/dL (ref 6.1–8.1)

## 2017-12-16 ENCOUNTER — Telehealth: Payer: Self-pay | Admitting: *Deleted

## 2017-12-16 NOTE — Telephone Encounter (Signed)
-----   Message from Elinor ParkinsonMax T Hyatt, [place] sent at 12/15/2017  7:05 AM EDT ----- Blood work looks good and may continue medication.

## 2017-12-16 NOTE — Telephone Encounter (Signed)
Left message informing pt of Dr. Hyatt's review of results and orders. 

## 2017-12-22 NOTE — Progress Notes (Signed)
Pt presents with mycotic infection of nails 1-5 bilateral  All other systems are negative  Laser therapy administered to affected nails and tolerated well. All safety precautions were in place. RE-appointed in 4 weeks for 2nd treatment 

## 2018-01-17 ENCOUNTER — Other Ambulatory Visit: Payer: Self-pay | Admitting: Internal Medicine

## 2018-01-19 ENCOUNTER — Ambulatory Visit: Payer: Self-pay

## 2018-01-19 ENCOUNTER — Other Ambulatory Visit: Payer: Self-pay | Admitting: Podiatry

## 2018-01-19 ENCOUNTER — Ambulatory Visit: Payer: BC Managed Care – PPO | Admitting: Podiatry

## 2018-01-19 DIAGNOSIS — B351 Tinea unguium: Secondary | ICD-10-CM

## 2018-01-19 DIAGNOSIS — Z79899 Other long term (current) drug therapy: Secondary | ICD-10-CM

## 2018-01-19 MED ORDER — TERBINAFINE HCL 250 MG PO TABS: 250.0000 mg | ORAL_TABLET | Freq: Every day | ORAL | 0 refills | Status: DC

## 2018-01-20 NOTE — Progress Notes (Signed)
Pt presents with mycotic infection of nails 1-5 bilateral  All other systems are negative  Laser therapy administered to affected nails and tolerated well. All safety precautions were in place. RE-appointed in 4 weeks for 3rd treatment 

## 2018-01-27 ENCOUNTER — Telehealth: Payer: Self-pay | Admitting: Podiatry

## 2018-01-27 LAB — HEPATIC FUNCTION PANEL
AG Ratio: 1.6 (calc) (ref 1.0–2.5)
ALKALINE PHOSPHATASE (APISO): 41 U/L (ref 40–115)
ALT: 18 U/L (ref 9–46)
AST: 24 U/L (ref 10–40)
Albumin: 4.3 g/dL (ref 3.6–5.1)
BILIRUBIN TOTAL: 0.5 mg/dL (ref 0.2–1.2)
Bilirubin, Direct: 0.1 mg/dL (ref 0.0–0.2)
Globulin: 2.7 g/dL (calc) (ref 1.9–3.7)
Indirect Bilirubin: 0.4 mg/dL (calc) (ref 0.2–1.2)
TOTAL PROTEIN: 7 g/dL (ref 6.1–8.1)

## 2018-01-27 NOTE — Telephone Encounter (Signed)
Patient called about form Dr Al Corpus gave him last visit about getting blood work done by [facility]. He's wanting to know if he needs to have that to give to him or can it be faxed? If you can call the patient back at [phone]

## 2018-01-27 NOTE — Telephone Encounter (Signed)
I informed pt the 01/19/2018 labs would be in the [facility] diagnostic system.

## 2018-01-28 ENCOUNTER — Telehealth: Payer: Self-pay | Admitting: *Deleted

## 2018-01-28 NOTE — Telephone Encounter (Signed)
-----   Message from Elinor Parkinson, [place] sent at 01/28/2018  6:46 AM EDT ----- Blood work looks good and may continue medication.

## 2018-01-28 NOTE — Telephone Encounter (Signed)
I informed pt of Dr. Hyatt's review of results and orders. 

## 2018-02-16 ENCOUNTER — Ambulatory Visit: Payer: Self-pay

## 2018-02-16 DIAGNOSIS — L603 Nail dystrophy: Secondary | ICD-10-CM

## 2018-02-16 DIAGNOSIS — B351 Tinea unguium: Secondary | ICD-10-CM

## 2018-02-23 NOTE — Progress Notes (Signed)
Pt presents with mycotic infection of nails 1-5 bilateral  All other systems are negative  Laser therapy administered to affected nails and tolerated well. All safety precautions were in place. RE-appointed in 4 weeks for 4th treatment 

## 2018-02-25 ENCOUNTER — Ambulatory Visit: Payer: BC Managed Care – PPO | Admitting: Internal Medicine

## 2018-02-25 ENCOUNTER — Encounter: Payer: Self-pay | Admitting: Internal Medicine

## 2018-02-25 VITALS — BP 110/80 | HR 69 | Temp 98.0°F | Ht 69.0 in | Wt 183.0 lb

## 2018-02-25 DIAGNOSIS — R05 Cough: Secondary | ICD-10-CM | POA: Diagnosis not present

## 2018-02-25 DIAGNOSIS — R059 Cough, unspecified: Secondary | ICD-10-CM

## 2018-02-25 DIAGNOSIS — J029 Acute pharyngitis, unspecified: Secondary | ICD-10-CM

## 2018-02-25 MED ORDER — AZITHROMYCIN 250 MG PO TABS: ORAL_TABLET | ORAL | 0 refills | Status: DC

## 2018-02-25 MED ORDER — HYDROCOD POLST-CPM POLST ER 10-8 MG/5ML PO SUER: 5.0000 mL | Freq: Two times a day (BID) | ORAL | 0 refills | Status: DC | PRN

## 2018-02-25 NOTE — Progress Notes (Deleted)
   Subjective:    Patient ID: Brad Holland, male    DOB: [birth year], [age] y.o.   MRN: [id number]  HPI    Review of Systems     Objective:   Physical Exam        Assessment & Plan:

## 2018-02-25 NOTE — Progress Notes (Signed)
   Subjective:    Patient ID: Brad Holland, male    DOB: 12-30-1975, 42 y.o.   MRN: [id number]  HPI Patient is come down with a respiratory infection.  Has sore throat.  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  He will be there about 2 weeks.  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.  He will be teaching at a [employer] seminar in [place].    Review of Systems he has a history of anxiety.  Has had some cough and sore throat pain.     Objective:   Physical Exam Pharynx is only slightly injected.  He does not sound hoarse but sounds a bit nasally congested.  TMs are clear.  Neck supple.  Chest clear.       Assessment & Plan:  Acute pharyngitis  Plan: Tussionex 1 teaspoon p.o. every 12 hours as needed cough.  Zithromax Z-Pak take 2 tablets day 1 followed by 1 tablet days 2 through 5.  Rest and drink plenty of fluids.

## 2018-03-09 ENCOUNTER — Ambulatory Visit: Payer: BC Managed Care – PPO | Admitting: Internal Medicine

## 2018-03-09 VITALS — BP 100/60 | HR 74 | Temp 98.3°F | Ht 69.0 in | Wt 183.0 lb

## 2018-03-09 DIAGNOSIS — R053 Chronic cough: Secondary | ICD-10-CM

## 2018-03-09 DIAGNOSIS — R05 Cough: Secondary | ICD-10-CM

## 2018-03-09 MED ORDER — PREDNISONE 10 MG PO TABS: ORAL_TABLET | ORAL | 0 refills | Status: DC

## 2018-03-09 MED ORDER — LEVOFLOXACIN 500 MG PO TABS: 500.0000 mg | ORAL_TABLET | Freq: Every day | ORAL | 1 refills | Status: DC

## 2018-03-09 NOTE — Progress Notes (Signed)
   Subjective:    Patient ID: Brad Holland, male    DOB: [birth year], 42 y.o.   MRN: [id number]  HPI He was here initially on June 6 for pharyngitis and cough treated with Tussionex and Zithromax Z-PAK.  Says he did not improve.  [place] and is somewhat anxious about persistent cough.    Review of Systems no fever or chills.  Cough is mostly dry.     Objective:   Physical Exam  Skin warm and dry.  Nodes none.  Pharynx and TMs are clear.  Neck is supple.  Chest clear.  With him going out of the country I suggested chest x-ray which proved to be negative      Assessment & Plan:  Protracted cough  Plan: Levaquin 500 mg daily for 7 days with 1 refill to take with him on the trip.  Prednisone and tapering course  with 4 tablets for 2 days, 3 tablets for 2 days, 2 tablets for 2 days then 1 tablet for 2 days before discontinuing.  Patient reassured.

## 2018-03-10 ENCOUNTER — Ambulatory Visit
Admission: RE | Admit: 2018-03-10 | Discharge: 2018-03-10 | Disposition: A | Discharge status: Home / Self Care | Payer: BC Managed Care – PPO | Source: Ambulatory Visit | Attending: Internal Medicine | Admitting: Internal Medicine

## 2018-03-10 DIAGNOSIS — R05 Cough: Secondary | ICD-10-CM

## 2018-03-10 DIAGNOSIS — R053 Chronic cough: Secondary | ICD-10-CM

## 2018-03-16 ENCOUNTER — Other Ambulatory Visit: Payer: BC Managed Care – PPO

## 2018-03-18 ENCOUNTER — Other Ambulatory Visit: Payer: Self-pay | Admitting: Internal Medicine

## 2018-03-19 ENCOUNTER — Encounter: Payer: Self-pay | Admitting: Internal Medicine

## 2018-03-19 NOTE — Patient Instructions (Addendum)
Take Zithromax Z-PAK as directed 2 tablets p.o. day 1 followed by 1 tablet days 2 through 5.  Tussionex sparingly for cough.  Rest and drink plenty of fluids.

## 2018-03-19 NOTE — Patient Instructions (Addendum)
Chest x-ray performed and is negative.  Take prednisone and tapering course as directed.  Levaquin 500 mg daily for 7 days.

## 2018-04-09 ENCOUNTER — Other Ambulatory Visit: Payer: BC Managed Care – PPO

## 2018-04-16 ENCOUNTER — Telehealth: Payer: Self-pay | Admitting: Podiatry

## 2018-04-16 NOTE — Telephone Encounter (Addendum)
I informed pt he had received 90 doses one time and then 30 doses and that was therapeutic dosing that it took 6-9 months to see healthy outgrowth, and he was also receiving the laser therapy. I told pt the lamisil stayed in his system and was continuing to work. I told pt I would inform Dr. Al CorpusHyatt to see if he had further instructions or if he would like to see pt again. Pt agreed. Pt stated I could leave a message on his phone.

## 2018-04-16 NOTE — Telephone Encounter (Signed)
I'm just about out of the fungal medication and I wanted to make sure Dr. Al CorpusHyatt didn't want me to get a refill since there are no refills. The condition has not completely cleared up yet. I'm getting ready to go on vacation so if someone could call me and let me know. My number is [phone]. Thank you.

## 2018-04-17 NOTE — Telephone Encounter (Signed)
Just have him in to see me when he gets back.

## 2018-04-19 NOTE — Telephone Encounter (Signed)
Left message informing pt of Dr. Hyatt's orders. 

## 2018-04-20 NOTE — Telephone Encounter (Addendum)
Pt asked again did Dr. Al CorpusHyatt want him to continue on the Lamisil until seen in office. Dr. Al CorpusHyatt states the Lamisil stays in the body for a very long time allowing for the slow outgrow of nails, and pt does not need a refill at this time. Left message informing pt of Dr. Geryl RankinsHyatt's statement.

## 2018-06-10 ENCOUNTER — Ambulatory Visit (INDEPENDENT_AMBULATORY_CARE_PROVIDER_SITE_OTHER): Payer: Self-pay | Admitting: Podiatry

## 2018-06-10 DIAGNOSIS — L603 Nail dystrophy: Secondary | ICD-10-CM

## 2018-06-10 DIAGNOSIS — Z79899 Other long term (current) drug therapy: Secondary | ICD-10-CM

## 2018-06-14 NOTE — Progress Notes (Signed)
Pt presents with mycotic infection of nails 1-5 bilateral  All other systems are negative  Laser therapy administered to affected nails and tolerated well. All safety precautions were in place. RE-appointed in 4 weeks for 5th treatment 

## 2018-07-08 ENCOUNTER — Ambulatory Visit: Payer: Self-pay | Admitting: Podiatry

## 2018-07-08 DIAGNOSIS — B351 Tinea unguium: Secondary | ICD-10-CM

## 2018-07-08 DIAGNOSIS — L603 Nail dystrophy: Secondary | ICD-10-CM

## 2018-07-15 NOTE — Progress Notes (Signed)
Pt presents with mycotic infection of nails 1-5 bilateral  All other systems are negative  Laser therapy administered to affected nails and tolerated well. All safety precautions were in place. RE-appointed in 4 weeks for 4th treatment 

## 2018-07-30 ENCOUNTER — Encounter (INDEPENDENT_AMBULATORY_CARE_PROVIDER_SITE_OTHER): Payer: Self-pay | Admitting: Family Medicine

## 2018-07-30 ENCOUNTER — Ambulatory Visit (INDEPENDENT_AMBULATORY_CARE_PROVIDER_SITE_OTHER): Payer: BC Managed Care – PPO | Admitting: Family Medicine

## 2018-07-30 DIAGNOSIS — R109 Unspecified abdominal pain: Secondary | ICD-10-CM

## 2018-07-30 NOTE — Progress Notes (Signed)
Office Visit Note   Patient: Brad Holland           Date of Birth: [birth year]           MRN: [id number] Visit Date: 07/30/2018 Requested by: Margaree Mackintosh, MD [address] [place], [place] [address] PCP: Margaree Mackintosh, MD  Subjective: Chief Complaint  Patient presents with  . left flank pain x 3 weeks    HPI: He is a 42 year old seen at the request of Lorenda Peck for abdominal pain.  Earlier this week some rope pull exercises at the gym.  He did not have any immediate pain, but a day or 2 later he developed severe sudden pain in his left lower back radiating around the abdomen underneath his rib.  He has a long-standing history of myofascial pain problems.  He sees Lorane on a regular basis.  He went to see her for his current problem and he is starting to get better.  His main concern is that he wanted to be sure he did not have some internal problem because he did notice a change in his pain after having a bowel movement.  Denies any fevers or chills, denies any urinary symptoms.  He has a family history of diverticulosis and his mother but he has never had any troubles himself.  No personal history of kidney stones.  He works as a Runner, broadcasting/film/video at [employer].              ROS: Otherwise noncontributory  Objective: Vital Signs: There were no vitals taken for this visit.  Physical Exam:  Abdomen: No visible rash.  Abdomen is soft, bowel sounds are active.  No hepatosplenomegaly detectable.  No tenderness to palpation of his left lower ribs, and position feels normal.  Area of maximum tenderness is below the rib in the musculature anterior to the midaxillary line.  No detectable hernia.  Imaging: Musculoskeletal ultrasound: I imaged the area of tenderness but did not bill for the procedure.  Muscle layers look intact with no defect or tearing.  Unable to visualize the spleen.  Assessment & Plan: 1.  Left abdominal pain, etiology uncertain but clinically improving. -Urinalysis  to look for microscopic hematuria which might indicate kidney stone.  If positive, then possible CT scan if symptoms persist. -GI consult would be another consideration to look for diverticulosis, but would first proceed with CT scan.   Follow-Up Instructions: No follow-ups on file.      Procedures: No procedures performed  No notes on file    PMFS History: Patient Active Problem List   Diagnosis Date Noted  . Musculoskeletal pain 12/15/2016  . Wrist pain, chronic 02/27/2015  . Ulnar nerve impingement 02/27/2015  . Gastroesophageal reflux disease without esophagitis 08/25/2014  . Hoarseness, chronic 07/21/2014  . Left shoulder pain 09/30/2012  . History of smoking 10/18/2011  . Chest pain at rest 09/30/2011  . Left ventricular hypertrophy 09/30/2011  . ANXIETY 12/30/2007  . COMMON MIGRAINE 12/30/2007  . DIVERTICULOSIS, COLON 12/30/2007   Past Medical History:  Diagnosis Date  . Anal fissure   . Anxiety   . Complication of anesthesia    sensitivity to eggs  . Depression   . Migraine headache    sporadic ? 1-2 per week    Family History  Problem Relation Age of Onset  . Bladder Cancer Father   . Irritable bowel syndrome Mother     Past Surgical History:  Procedure Laterality Date  . BRAVO PH STUDY  N/A 08/07/2014   Procedure: BRAVO PH STUDY;  Surgeon: Louis Meckel, MD;  Location: [facility];  Service: Endoscopy;  Laterality: N/A;  . ESOPHAGOGASTRODUODENOSCOPY (EGD) WITH PROPOFOL N/A 08/07/2014   Procedure: ESOPHAGOGASTRODUODENOSCOPY (EGD) WITH PROPOFOL;  Surgeon: Louis Meckel, MD;  Location: [facility];  Service: Endoscopy;  Laterality: N/A;  . TONSILLECTOMY     Social History   Occupational History  . Occupation: professor    Employer: [employer]  Tobacco Use  . Smoking status: Former Smoker    Types: Cigarettes    Last attempt to quit: 06/07/2009    Years since quitting: 9.1  . Smokeless tobacco: Never Used  Substance and Sexual Activity  .  Alcohol use: No  . Drug use: No  . Sexual activity: Yes

## 2018-07-31 ENCOUNTER — Telehealth (INDEPENDENT_AMBULATORY_CARE_PROVIDER_SITE_OTHER): Payer: Self-pay | Admitting: Family Medicine

## 2018-07-31 NOTE — Telephone Encounter (Signed)
First urine test showed no blood.  Doubtful that this is a kidney stone.  MJH

## 2018-08-01 LAB — URINALYSIS, ROUTINE W REFLEX MICROSCOPIC
Bilirubin Urine: NEGATIVE
Glucose, UA: NEGATIVE
Hgb urine dipstick: NEGATIVE
KETONES UR: NEGATIVE
Leukocytes, UA: NEGATIVE
NITRITE: NEGATIVE
PROTEIN: NEGATIVE
SPECIFIC GRAVITY, URINE: 1.004 (ref 1.001–1.03)
pH: 6.5 (ref 5.0–8.0)

## 2018-08-01 LAB — URINE CULTURE
MICRO NUMBER: 91348148
Result:: NO GROWTH
SPECIMEN QUALITY:: ADEQUATE

## 2018-08-05 ENCOUNTER — Ambulatory Visit: Payer: Self-pay

## 2018-08-05 DIAGNOSIS — L603 Nail dystrophy: Secondary | ICD-10-CM

## 2018-08-05 DIAGNOSIS — B351 Tinea unguium: Secondary | ICD-10-CM

## 2018-08-06 NOTE — Progress Notes (Signed)
Pt presents with mycotic infection of nails 1-5 bilateral  All other systems are negative  Laser therapy administered to affected nails and tolerated well. All safety precautions were in place. RE-appointed in 4 weeks for 5th treatment.Dispensed Tolcyclen

## 2018-10-05 ENCOUNTER — Ambulatory Visit: Payer: BC Managed Care – PPO

## 2018-10-05 DIAGNOSIS — Z79899 Other long term (current) drug therapy: Secondary | ICD-10-CM

## 2018-10-05 MED ORDER — TERBINAFINE HCL 250 MG PO TABS: 250.0000 mg | ORAL_TABLET | ORAL | 0 refills | Status: DC

## 2018-10-07 LAB — HEPATIC FUNCTION PANEL
AG RATIO: 1.6 (calc) (ref 1.0–2.5)
ALT: 20 U/L (ref 9–46)
AST: 21 U/L (ref 10–40)
Albumin: 4.4 g/dL (ref 3.6–5.1)
Alkaline phosphatase (APISO): 45 U/L (ref 40–115)
Bilirubin, Direct: 0.1 mg/dL (ref 0.0–0.2)
Globulin: 2.7 g/dL (calc) (ref 1.9–3.7)
Indirect Bilirubin: 0.4 mg/dL (calc) (ref 0.2–1.2)
Total Bilirubin: 0.5 mg/dL (ref 0.2–1.2)
Total Protein: 7.1 g/dL (ref 6.1–8.1)

## 2018-10-11 NOTE — Progress Notes (Signed)
Pt presents with mycotic infection of nails 1-5 bilateral, patient's hallux nails appear to be symptomatic for fungus.  All other systems are negative  Laser therapy administered to affected nails and tolerated well. All safety precautions were in place.  Orders were obtained from Dr. Al Corpus, patient is to take Lamisil 250 mg every other day, #30 was dispensed.  Repeat laser next month.  Hepatic function panel was ordered as well.  Follow-up next month for laser treatment.

## 2018-10-12 ENCOUNTER — Telehealth: Payer: Self-pay | Admitting: *Deleted

## 2018-10-12 NOTE — Telephone Encounter (Signed)
I informed pt of Dr. Hyatt's review of results and orders. 

## 2018-10-12 NOTE — Telephone Encounter (Signed)
-----   Message from Elinor ParkinsonMax T Hyatt, [place] sent at 10/11/2018  7:33 AM EST ----- Blood work looks perfect and may continue with medication.

## 2018-11-09 ENCOUNTER — Other Ambulatory Visit: Payer: BC Managed Care – PPO

## 2018-11-12 ENCOUNTER — Telehealth: Payer: Self-pay | Admitting: Internal Medicine

## 2018-11-12 NOTE — Telephone Encounter (Signed)
This has been called in

## 2018-11-12 NOTE — Telephone Encounter (Signed)
Patient is out of town for business.  He has a migraine and didn't take any medication with him for his migraines.  He wants to know if you would please call just 1 Maxalt 10mg  in for him in [place] where he is today.  He said insurance won't pay for it, so he only wants you to call in 1.    Pharmacy:  [facility] - [address] [place], [place]          [phone]  His phone #:  [phone] (he wants [place] to call him back to let him know if we can do this)  Thank you.

## 2018-11-12 NOTE — Telephone Encounter (Signed)
Called and left VM for patient that this has been called in for him.

## 2018-11-16 ENCOUNTER — Ambulatory Visit: Payer: Self-pay

## 2018-11-16 DIAGNOSIS — L603 Nail dystrophy: Secondary | ICD-10-CM

## 2018-11-16 DIAGNOSIS — B351 Tinea unguium: Secondary | ICD-10-CM

## 2018-11-16 DIAGNOSIS — Z79899 Other long term (current) drug therapy: Secondary | ICD-10-CM

## 2018-11-16 NOTE — Progress Notes (Signed)
Pt presents with mycotic infection of nails 1-5 bilateral, patient's hallux nails appear to be symptomatic for fungus.  All other systems are negative  Laser therapy administered to affected nails and tolerated well. All safety precautions were in place.  Patient is to follow-up next month to recheck his nails to see if further laser treatment is needed.  Also ordered hepatic function panel.

## 2018-12-08 ENCOUNTER — Other Ambulatory Visit: Payer: Self-pay | Admitting: Internal Medicine

## 2018-12-15 ENCOUNTER — Other Ambulatory Visit: Payer: BC Managed Care – PPO

## 2019-03-31 ENCOUNTER — Other Ambulatory Visit: Payer: Self-pay | Admitting: Internal Medicine

## 2019-06-10 ENCOUNTER — Encounter: Payer: Self-pay | Admitting: Internal Medicine

## 2019-06-10 ENCOUNTER — Other Ambulatory Visit: Payer: Self-pay

## 2019-06-10 ENCOUNTER — Ambulatory Visit (INDEPENDENT_AMBULATORY_CARE_PROVIDER_SITE_OTHER): Payer: BC Managed Care – PPO | Admitting: Internal Medicine

## 2019-06-10 DIAGNOSIS — Z23 Encounter for immunization: Secondary | ICD-10-CM | POA: Diagnosis not present

## 2019-06-10 NOTE — Patient Instructions (Addendum)
Patient received a flu vaccine IM R deltoid, AV, CMA  

## 2019-06-10 NOTE — Progress Notes (Signed)
Flu vaccine given by CMA 

## 2019-06-20 ENCOUNTER — Other Ambulatory Visit: Payer: Self-pay | Admitting: Internal Medicine

## 2019-08-01 ENCOUNTER — Telehealth: Payer: Self-pay | Admitting: Internal Medicine

## 2019-08-01 NOTE — Telephone Encounter (Signed)
He needs OV and then we can address. They will not see him unless I provide some history and lab work.

## 2019-08-01 NOTE — Telephone Encounter (Signed)
Appointment scheduled.

## 2019-08-01 NOTE — Telephone Encounter (Signed)
Brad Holland [id number]  [name] called to say he would like to get a referral to a Urologist, for family history of bladder cancer, and he just recently spotted some blood on his bath towel, with no idea where it came from. I let him know he may need to come in for an office visit first  And that he also needed to schedule an CPE

## 2019-08-04 ENCOUNTER — Other Ambulatory Visit: Payer: Self-pay

## 2019-08-04 ENCOUNTER — Encounter: Payer: Self-pay | Admitting: Internal Medicine

## 2019-08-04 ENCOUNTER — Ambulatory Visit: Payer: BC Managed Care – PPO | Admitting: Internal Medicine

## 2019-08-04 VITALS — BP 100/70 | HR 68 | Temp 97.9°F | Ht 69.0 in | Wt 188.0 lb

## 2019-08-04 DIAGNOSIS — Z8052 Family history of malignant neoplasm of bladder: Secondary | ICD-10-CM | POA: Diagnosis not present

## 2019-08-04 LAB — POCT URINALYSIS DIPSTICK
Appearance: NEGATIVE
Bilirubin, UA: NEGATIVE
Blood, UA: NEGATIVE
Glucose, UA: NEGATIVE
Ketones, UA: NEGATIVE
Leukocytes, UA: NEGATIVE
Nitrite, UA: NEGATIVE
Odor: NEGATIVE
Protein, UA: NEGATIVE
Spec Grav, UA: 1.01 (ref 1.010–1.025)
Urobilinogen, UA: 0.2 E.U./dL
pH, UA: 7.5 (ref 5.0–8.0)

## 2019-08-05 LAB — CYTOLOGY - NON PAP

## 2019-08-05 LAB — URINALYSIS, MICROSCOPIC ONLY
Bacteria, UA: NONE SEEN /HPF
Hyaline Cast: NONE SEEN /LPF
RBC / HPF: NONE SEEN /HPF (ref 0–2)
Squamous Epithelial / HPF: NONE SEEN /HPF (ref <=5)
WBC, UA: NONE SEEN /HPF (ref 0–5)

## 2019-08-05 LAB — NON-GYN, SPECIMEN A

## 2019-08-08 ENCOUNTER — Telehealth: Payer: Self-pay | Admitting: Internal Medicine

## 2019-08-08 NOTE — Telephone Encounter (Signed)
Faxed referral, demographics, and office notes to [facility]

## 2019-08-08 NOTE — Progress Notes (Signed)
   Subjective:    Patient ID: Brad Holland, male    DOB: Jul 16, 1976, 43 y.o.   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 Thought it might be from penis. Could not identify any bleeding on skin including extremities or penis or rectum. Completely asymtomatic but has history of anxiety. General health is good. Father had bladder cancer so he is concerned.     Review of Systems see above no dysuria or hematuria.     Objective:   Physical Exam Afebrile. BP 100/70 pulse 68 No evidence of rectal or perianal bleeding. Urine dipstick is normal.        Assessment & Plan:  Family history of bladder cancer-  Urine cytology is negative  Blood on towel not sure where it came from- dipstick urine is negative for blood  Anxiety- pt wants to be checked by Urologist with family hx of bladder cancer. He does not smoke. Refer to urologist

## 2019-08-08 NOTE — Patient Instructions (Signed)
Refer to Urologist because of patient anxiety about bladder cancer

## 2019-08-09 ENCOUNTER — Telehealth: Payer: Self-pay | Admitting: Internal Medicine

## 2019-08-09 NOTE — Telephone Encounter (Signed)
There is no good treatment for this condition. He can be seen by ENT.

## 2019-08-09 NOTE — Telephone Encounter (Signed)
[name] called back, so I placed referral

## 2019-08-09 NOTE — Telephone Encounter (Signed)
Chisum Habenicht [phone]  [name] called to say that he forgot to mention the other day that he has had a ringing in his ears for about 6 months. He would like to know if he needs to come in and be seen or be referred to ENT?

## 2019-08-09 NOTE — Telephone Encounter (Signed)
LVM to CB.

## 2019-12-09 ENCOUNTER — Other Ambulatory Visit: Payer: Self-pay | Admitting: Internal Medicine

## 2020-05-03 ENCOUNTER — Other Ambulatory Visit: Payer: Self-pay | Admitting: Internal Medicine

## 2020-05-10 ENCOUNTER — Other Ambulatory Visit: Payer: Self-pay

## 2020-05-10 NOTE — Telephone Encounter (Signed)
He needs a visit before refilling

## 2020-05-10 NOTE — Telephone Encounter (Signed)
Patient called to request a refill.  

## 2020-05-12 ENCOUNTER — Other Ambulatory Visit: Payer: Self-pay | Admitting: Internal Medicine

## 2020-05-14 NOTE — Telephone Encounter (Signed)
Left message on 05/11/20 to call back to book appt.

## 2020-05-15 ENCOUNTER — Ambulatory Visit: Payer: BC Managed Care – PPO | Admitting: Internal Medicine

## 2020-05-15 ENCOUNTER — Other Ambulatory Visit: Payer: Self-pay

## 2020-05-15 ENCOUNTER — Encounter: Payer: Self-pay | Admitting: Internal Medicine

## 2020-05-15 VITALS — BP 110/70 | HR 80 | Ht 69.0 in | Wt 193.0 lb

## 2020-05-15 DIAGNOSIS — Z8052 Family history of malignant neoplasm of bladder: Secondary | ICD-10-CM

## 2020-05-15 DIAGNOSIS — Z8669 Personal history of other diseases of the nervous system and sense organs: Secondary | ICD-10-CM

## 2020-05-15 MED ORDER — RIZATRIPTAN BENZOATE 10 MG PO TABS: 10.0000 mg | ORAL_TABLET | ORAL | 3 refills | Status: DC | PRN

## 2020-05-15 NOTE — Progress Notes (Signed)
   Subjective:    Patient ID: Brad Holland, male    DOB: 1976/09/17, 44 y.o.   MRN: [id number]  HPI Patient is in today to discuss migraine headache management.  He needs a refill on Maxalt.  It is expensive.  He prefers to have 10 mg tablets and break them in half because of expense.  Migraines are fairly well controlled with Maxalt and he does not want to be on any other medication.  Longstanding history of migraine headaches.  Non-smoker.  Formerly smoked for some 15 years but quit around 2009.  Never smoked heavily.  Social alcohol consumption.  History of GE reflux seen by gastroenterology in 2015.  History of anxiety treated with SSRI and sometimes Klonopin.  Social history: He is married.  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   Family history of bladder cancer onset at age 61.  Patient has family history of 2 uncles with bladder cancer.  He has no urinary symptoms.  No dysuria, urgency, hesitancy or hematuria.  However, because of his family history he would like to be checked out by urologist.  We will make referral for him.  It seems that we placed a referral in November.  Father had history of bladder cancer at age 53. Review of Systems History of erectile dysfunction treated with Cialis.  History of dysphonia but this seems to have improved    Objective:   Physical Exam BP 110/70, pulse 80 regular, pulse oximetry 97% weight 193 pounds height 5 feet 9 inches  Urine dipstick was not checked today as he is asymptomatic but was perfectly normal in November 2020.  Cranial nerves II through XII grossly intact and there are no focal deficits on brief neurological exam.  Affect thought and judgment are normal.  Currently not having a migraine headache.       Assessment & Plan:  Family history of bladder cancer-patient request referral to urology for evaluation due to father's onset of bladder cancer at young age and 2 uncles  with history of bladder cancer.  Patient formerly smoked but not heavily.  History of migraine headaches-refill Maxalt as requested he would like to take one half of the 10 mg tablet at onset of migraine.

## 2020-05-17 NOTE — Patient Instructions (Signed)
Request urology referral regarding family history of bladder cancer.  Refill Maxalt as requested.

## 2020-11-23 ENCOUNTER — Other Ambulatory Visit: Payer: Self-pay | Admitting: Internal Medicine

## 2020-11-23 NOTE — Telephone Encounter (Signed)
Please call him. He needs a health maintenance exam

## 2020-11-29 NOTE — Telephone Encounter (Signed)
CPE scheduled in May

## 2020-11-29 NOTE — Telephone Encounter (Signed)
LVM to CB and schedule CPE 

## 2021-01-03 ENCOUNTER — Ambulatory Visit: Payer: BC Managed Care – PPO | Admitting: Internal Medicine

## 2021-01-03 ENCOUNTER — Encounter: Payer: Self-pay | Admitting: Internal Medicine

## 2021-01-03 ENCOUNTER — Telehealth: Payer: Self-pay | Admitting: Internal Medicine

## 2021-01-03 ENCOUNTER — Other Ambulatory Visit: Payer: Self-pay

## 2021-01-03 VITALS — BP 120/88 | HR 78 | Temp 98.5°F | Ht 69.0 in | Wt 190.0 lb

## 2021-01-03 DIAGNOSIS — J069 Acute upper respiratory infection, unspecified: Secondary | ICD-10-CM | POA: Diagnosis not present

## 2021-01-03 MED ORDER — AZITHROMYCIN 250 MG PO TABS: ORAL_TABLET | ORAL | 1 refills | Status: DC

## 2021-01-03 MED ORDER — AZITHROMYCIN 250 MG PO TABS: ORAL_TABLET | ORAL | 0 refills | Status: DC

## 2021-01-03 NOTE — Telephone Encounter (Signed)
OV

## 2021-01-03 NOTE — Telephone Encounter (Signed)
Brad [place] [phone]  [name] called to say about 10 days ago he started having some head congestion and off and on diarrhea, no fever. He has done 2 at home COVID test that have been negative. Not getting any better, Has had COVID vaccine and booster.

## 2021-01-03 NOTE — Progress Notes (Signed)
   Subjective:    Patient ID: DAMIONE ROBIDEAU, male    DOB: [birth year], 45 y.o.   MRN: [id number]  HPI  45 year old Male had onset runny nose, sneezing, coughing, some 10 days ago.  Has been vaccinated for COVID-19.  Our records indicate he has received 3 doses of Moderna vaccine.  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  He has a history of migraine headaches.    Review of Systems see above regarding symptoms.  Denies fever or chills     Objective:   Physical Exam Temperature 98.5 degrees pulse 78 regular pulse oximetry 98% weight 190 pounds BMI 28.06 blood pressure 120/88  Skin is warm and dry.  No cervical adenopathy.  Pharynx very slightly injected.  TMs clear.  Neck supple.  Chest clear to auscultation.       Assessment & Plan:  Acute upper respiratory infection  Plan: Zithromax Z-PAK 2 tablets day 1 followed by 1 tablet days 2 through 5 with 1 refill.  He is to get this prescription refill and take it with him to [place] this summer.

## 2021-01-03 NOTE — Telephone Encounter (Signed)
scheduled

## 2021-01-18 NOTE — Patient Instructions (Signed)
It was a pleasure to see you today.  I am sorry you are not feeling well.  Take Zithromax Z-PAK 2 tablets day 1 followed by 1 tablet days 2 through 5.  You have a refill of this prescription to take with you to [place] this summer.

## 2021-02-07 ENCOUNTER — Other Ambulatory Visit: Payer: BC Managed Care – PPO | Admitting: Internal Medicine

## 2021-02-08 ENCOUNTER — Encounter: Payer: Self-pay | Admitting: Internal Medicine

## 2021-02-08 ENCOUNTER — Other Ambulatory Visit: Payer: Self-pay

## 2021-02-08 ENCOUNTER — Other Ambulatory Visit: Payer: BC Managed Care – PPO | Admitting: Internal Medicine

## 2021-02-08 ENCOUNTER — Ambulatory Visit (INDEPENDENT_AMBULATORY_CARE_PROVIDER_SITE_OTHER): Payer: BC Managed Care – PPO | Admitting: Internal Medicine

## 2021-02-08 VITALS — BP 100/60 | Ht 69.0 in | Wt 191.0 lb

## 2021-02-08 DIAGNOSIS — Z0000 Encounter for general adult medical examination without abnormal findings: Secondary | ICD-10-CM | POA: Diagnosis not present

## 2021-02-08 DIAGNOSIS — Z8052 Family history of malignant neoplasm of bladder: Secondary | ICD-10-CM

## 2021-02-08 DIAGNOSIS — Z8669 Personal history of other diseases of the nervous system and sense organs: Secondary | ICD-10-CM | POA: Diagnosis not present

## 2021-02-08 LAB — POCT URINALYSIS DIPSTICK
Appearance: NEGATIVE
Bilirubin, UA: NEGATIVE
Blood, UA: NEGATIVE
Glucose, UA: NEGATIVE
Ketones, UA: NEGATIVE
Leukocytes, UA: NEGATIVE
Nitrite, UA: NEGATIVE
Odor: NEGATIVE
Protein, UA: NEGATIVE
Spec Grav, UA: 1.01 (ref 1.010–1.025)
Urobilinogen, UA: 0.2 E.U./dL
pH, UA: 7 (ref 5.0–8.0)

## 2021-02-08 MED ORDER — AZITHROMYCIN 250 MG PO TABS: ORAL_TABLET | ORAL | 0 refills | Status: DC

## 2021-02-08 MED ORDER — AZITHROMYCIN 250 MG PO TABS: ORAL_TABLET | ORAL | 0 refills | Status: AC

## 2021-02-08 NOTE — Progress Notes (Signed)
Subjective:    Patient ID: Brad Holland, male    DOB: [birth year], 45 y.o.   MRN: [id number]  HPI 45 year old Male for health maintenance exam and evaluation of medical issues.    He has a family history of bladder cancer on paternal side of family.  He saw urologist for evaluation and was told he could have cystoscopy.  Suggest patient defer cystoscopy until his 63s.  He is a non-smoker.  He has no hematuria.  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  Prior to this, he and his family are going to vacation in [place].  I am calling in a Zithromax Z-PAK for him to take for travel.  He has Klonopin for anxiety on hand and takes Lexapro 20 mg daily for anxiety.    He also takes Maxalt as needed for migraine headaches.  SHx: Married to [employer]. 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.  He is a former smoker.  Social alcohol consumption.  2 children.    He has a history of dysphonia and struggled with that for some time but that has improved.  Had upper endoscopy by Dr. Arlyce Dice in 2015 for hoarseness.  That study was normal.  See note from January 17, 2014 regarding this.   History of bilateral tinnitus with normal hearing seen by Dr. Lazarus Salines in December 2020.  Remote history of carbuncle left axilla treated with antibiotics in 2014.  History of tinea pedis in 2014.  History of left shoulder pain in 2014 treated conservatively and eventually resolved.  Had negative MRI of the C-spine.  He went to physical therapy and saw Dr. Corliss Skains who thought he might have myofascial pain syndrome.  He also had an MRI of his shoulder by Dr. Teressa Senter and was found to have [facility] joint arthrosis and biceps tendinitis as well as a bursal sided abrasion of the supraspinatus.  It was treated conservatively and improved with time.   History of paronychia of right third finger.  Records indicate he has had 3 Moderna COVID-19 vaccines.  Social history: He is  married.  Family history of bladder cancer in father Review of Systems-  a bit anxious about upcoming trip     Objective:   Physical Exam Blood pressure 100/60 pulse oximetry 98% weight 191 pounds height 5 feet 9 inches, BMI 28.21, pulse 78 regular  Skin: Warm and dry.  No cervical adenopathy.  TMs are clear.  Neck is supple.  No carotid bruits.  No thyromegaly.  Chest is clear to auscultation without rales or wheezing.  Cardiac exam: Regular rate and rhythm normal S1 and S2 without murmurs or gallops.  Abdomen: Soft nondistended without hepatosplenomegaly masses or tenderness.  Rectal exam deferred due to age.  No lower extremity pitting edema or deformity.  Neuro: Intact without gross focal deficits.  Affect thought and judgment are normal.       Assessment & Plan:  History of migraine headaches treated with Maxalt  History of anxiety treated with Lexapro and Klonopin  Mild elevation of LDL at 113 but normal triglycerides, HDL is 48 and total cholesterol normal at 186.  This is very mild elevation of the LDL and should respond to diet and exercise.  Recheck in 1 year.  Episode of reactive hypoglycemia today because he was fasting and had migraine headache prior to blood draw.  His glucose was low at 60 on lab work.  [place]  prescribed Zithromax Z-PAK for trip to [place] in [place] should he respiratory infection symptoms while traveling.  Plan: Return in 1 year or as needed.

## 2021-02-09 LAB — COMPLETE METABOLIC PANEL WITH GFR
AG Ratio: 1.8 (calc) (ref 1.0–2.5)
ALT: 22 U/L (ref 9–46)
AST: 26 U/L (ref 10–40)
Albumin: 4.4 g/dL (ref 3.6–5.1)
Alkaline phosphatase (APISO): 46 U/L (ref 36–130)
BUN: 11 mg/dL (ref 7–25)
CO2: 31 mmol/L (ref 20–32)
Calcium: 9.4 mg/dL (ref 8.6–10.3)
Chloride: 102 mmol/L (ref 98–110)
Creat: 0.98 mg/dL (ref 0.60–1.35)
GFR, Est African American: 107 mL/min/{1.73_m2} (ref >=60)
GFR, Est Non African American: 92 mL/min/{1.73_m2} (ref >=60)
Globulin: 2.4 g/dL (calc) (ref 1.9–3.7)
Glucose, Bld: 60 mg/dL — ABNORMAL LOW (ref 65–139)
Potassium: 4.5 mmol/L (ref 3.5–5.3)
Sodium: 139 mmol/L (ref 135–146)
Total Bilirubin: 0.6 mg/dL (ref 0.2–1.2)
Total Protein: 6.8 g/dL (ref 6.1–8.1)

## 2021-02-09 LAB — CBC WITH DIFFERENTIAL/PLATELET
Absolute Monocytes: 390 cells/uL (ref 200–950)
Basophils Absolute: 10 cells/uL (ref 0–200)
Basophils Relative: 0.2 %
Eosinophils Absolute: 120 cells/uL (ref 15–500)
Eosinophils Relative: 2.3 %
HCT: 49.6 % (ref 38.5–50.0)
Hemoglobin: 16.5 g/dL (ref 13.2–17.1)
Lymphs Abs: 1856 cells/uL (ref 850–3900)
MCH: 30.7 pg (ref 27.0–33.0)
MCHC: 33.3 g/dL (ref 32.0–36.0)
MCV: 92.2 fL (ref 80.0–100.0)
MPV: 12.6 fL — ABNORMAL HIGH (ref 7.5–12.5)
Monocytes Relative: 7.5 %
Neutro Abs: 2824 cells/uL (ref 1500–7800)
Neutrophils Relative %: 54.3 %
Platelets: 155 10*3/uL (ref 140–400)
RBC: 5.38 10*6/uL (ref 4.20–5.80)
RDW: 13.4 % (ref 11.0–15.0)
Total Lymphocyte: 35.7 %
WBC: 5.2 10*3/uL (ref 3.8–10.8)

## 2021-02-09 LAB — LIPID PANEL
Cholesterol: 186 mg/dL (ref <200)
HDL: 48 mg/dL (ref >=40)
LDL Cholesterol (Calc): 113 mg/dL (calc) — ABNORMAL HIGH
Non-HDL Cholesterol (Calc): 138 mg/dL (calc) — ABNORMAL HIGH (ref <130)
Total CHOL/HDL Ratio: 3.9 (calc) (ref <5.0)
Triglycerides: 142 mg/dL (ref <150)

## 2021-02-09 NOTE — Patient Instructions (Addendum)
It was a pleasure to see you today.  Please take Zithromax Z-PAK with you on your trip to [place] in the [place] that is upcoming.  Labs are within normal limits except for decreased serum glucose due to reactive hypoglycemia from fasting and migraine headache.  You treated this appropriately with [place] moving this morning and symptoms resolved.  Return in 1 year or as needed.  Recommend COVID booster at least 2 weeks before traveling.

## 2021-02-16 ENCOUNTER — Encounter: Payer: Self-pay | Admitting: Internal Medicine

## 2021-02-16 ENCOUNTER — Telehealth (INDEPENDENT_AMBULATORY_CARE_PROVIDER_SITE_OTHER): Payer: BC Managed Care – PPO | Admitting: Internal Medicine

## 2021-02-16 DIAGNOSIS — U071 COVID-19: Secondary | ICD-10-CM

## 2021-02-16 MED ORDER — ONDANSETRON HCL 4 MG PO TABS: 4.0000 mg | ORAL_TABLET | Freq: Three times a day (TID) | ORAL | 1 refills | Status: DC | PRN

## 2021-02-16 NOTE — Telephone Encounter (Addendum)
Received call from Vena Austria, patient's wife, around 10:15 pm this evening.  Patient and his family (wife and 2 children) were in [place] earlier this week. They all drove there together. Wife had a conference there and patient has parents there. Everyone including wife, children, and parents came down with acute Covid-19. Patient has tested for Covid-19 several times since Wednesday and thus far has tested negative, has not tested today. He has malaise, fever, nausea, headache, myalgias.  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 Wife who is also a patient here is beginning to feel better however,  Professor Jacinto  has nausea, headache, malaise,fever, and myalgias.   He also has an anxiety disorder. He has had 3 Moderna Covid-19 vaccines.He has a history of migraine headaches.  I  have prescribed Paxlovid for him this evening as well as Zofran for nausea. His labs at time of CPE on Feb 08, 2021 revealed a normal GFR so there is no reason to reduce the dose of Paxlovid.  He is identified using 2 identifiers as Hugo Lybrand, E[place], a patient in this practice. He is at his home and I am at my office working this evening. He is agreeable to consultation this evening.  I asked that he take another home Covid test this evening. It is now positive for Covid-19. Symptoms have been present for about 4 days.  Patient is advised to rest and drink fluids. He may take Zofran 4 mg tablets every 8 hours as needed for nausea. May take Tylenol, Aleve, or Advil for fever /headache.   Time spent with telephone consultation this evening is 10 minutes.

## 2021-02-21 ENCOUNTER — Telehealth: Payer: Self-pay | Admitting: Internal Medicine

## 2021-02-21 NOTE — Telephone Encounter (Signed)
Faxed Positive Home Covid results to [facility] Department [phone], positive as of 02/16/2021

## 2021-03-01 ENCOUNTER — Other Ambulatory Visit: Payer: Self-pay | Admitting: Internal Medicine

## 2021-07-23 ENCOUNTER — Other Ambulatory Visit: Payer: Self-pay | Admitting: Internal Medicine

## 2021-08-17 ENCOUNTER — Telehealth: Payer: Self-pay | Admitting: Internal Medicine

## 2021-08-17 NOTE — Telephone Encounter (Signed)
Patient traveled to [place] for Thanksgiving to visit family. He was out hiking a couple of days ago. Discovered small tick, possibly a deer tick, in popliteal area. Has no fever chills, headache or rash but was concerned and called to see if antibiotics were appropriate. I think we can monitor patient and see if symptoms develop.He says there is no evidence of secondary infection at bite site and that he is currently having no symptoms such as headache, rash, myalgias.

## 2021-08-18 ENCOUNTER — Telehealth (INDEPENDENT_AMBULATORY_CARE_PROVIDER_SITE_OTHER): Payer: BC Managed Care – PPO | Admitting: Internal Medicine

## 2021-08-18 DIAGNOSIS — S80869A Insect bite (nonvenomous), unspecified lower leg, initial encounter: Secondary | ICD-10-CM | POA: Diagnosis not present

## 2021-08-18 DIAGNOSIS — W57XXXA Bitten or stung by nonvenomous insect and other nonvenomous arthropods, initial encounter: Secondary | ICD-10-CM

## 2021-08-18 MED ORDER — DOXYCYCLINE HYCLATE 100 MG PO TABS: ORAL_TABLET | ORAL | 0 refills | Status: DC

## 2021-08-18 NOTE — Telephone Encounter (Signed)
Patient had tick bite recently  about 3 days ago in [place]  while hiking. [place]  is a high endemic area for Lyme disease.  Patient hinks tick was attached behind his knee for at least 2 days. He has no Lyme disease symptoms but would like prophylaxis with Doxycycline. Up to Date data base was researched today. It is acceptable to give Doxycycline 100 mg 2 tabs one time dose given this scenario Will send this in  prescription at patient request.  Spoke with patient  initially by phone last evening. He called today about this recommendation and would like to have  antibiotic due to prolonged attachment of tick in high endemic area.  Currently has no fever, chills, headache or rash.  Patient is at home and I am at my office. He is identified using 2 identifiers as Nolon Nations, a patient is this practice. He is agreeable to speaking with me in this format.  E-scribed Doxycycline 100 mg 2 capsules one time dose for prophylaxis for possible Lyme disease exposure

## 2021-08-26 ENCOUNTER — Telehealth: Payer: Self-pay | Admitting: Internal Medicine

## 2021-08-26 ENCOUNTER — Encounter: Payer: Self-pay | Admitting: Internal Medicine

## 2021-08-26 ENCOUNTER — Ambulatory Visit (INDEPENDENT_AMBULATORY_CARE_PROVIDER_SITE_OTHER): Payer: BC Managed Care – PPO | Admitting: Internal Medicine

## 2021-08-26 ENCOUNTER — Other Ambulatory Visit: Payer: Self-pay

## 2021-08-26 VITALS — BP 118/70 | HR 75 | Resp 97 | Ht 68.0 in | Wt 191.0 lb

## 2021-08-26 DIAGNOSIS — S80861D Insect bite (nonvenomous), right lower leg, subsequent encounter: Secondary | ICD-10-CM

## 2021-08-26 DIAGNOSIS — F411 Generalized anxiety disorder: Secondary | ICD-10-CM

## 2021-08-26 DIAGNOSIS — W57XXXD Bitten or stung by nonvenomous insect and other nonvenomous arthropods, subsequent encounter: Secondary | ICD-10-CM

## 2021-08-26 MED ORDER — DOXYCYCLINE HYCLATE 100 MG PO TABS: 100.0000 mg | ORAL_TABLET | Freq: Two times a day (BID) | ORAL | 0 refills | Status: DC

## 2021-08-26 NOTE — Patient Instructions (Signed)
Doxycycline 100 mg twice a day for 7 days. 

## 2021-08-26 NOTE — Telephone Encounter (Signed)
Brad Holland [phone]  Brad Holland called to say he does not like the way his tick bite looks, it is not healing, it is red, tender. He would like to come in for you to look at it. He got it on Thanksgiving Day

## 2021-08-26 NOTE — Progress Notes (Signed)
   Subjective:    Patient ID: Brad Holland, male    DOB: 22-Mar-1976, 45 y.o.   MRN: [id number]  HPI He called on November 27 indicating he had had a tick bite about 3 days previously in [place] while hiking.  He was concerned because [place] is a high endemic area for Lyme disease.  Patient thought tick was attached behind his knee for at least 2 days.  Was treated with doxycycline 100 mg 2 tabs one-time dose after consulting Up-to-Date database.  At that time had no fever chills headache or rash.  He is now here in person to discuss tick bite of right lower leg behind right knee.  He thinks he might have a slight headache.  He admits he does have anxiety about this issue.    Review of Systems no fever, chills, myalgias he did take the doxycycline as previously prescribed.     Objective:   Physical Exam Insect bite noted and right popliteal area.  No area of draining.  Bite site appears to be clean with some slight surrounding erythema.       Assessment & Plan:  Tick bite right popliteal area  Plan: He remains very concerned about this and we will proceed with treating him with doxycycline 100 mg twice daily for 7 days.

## 2021-08-26 NOTE — Telephone Encounter (Signed)
scheduled

## 2021-11-25 ENCOUNTER — Telehealth: Payer: Self-pay | Admitting: Internal Medicine

## 2021-11-25 NOTE — Telephone Encounter (Signed)
scheduled

## 2021-11-25 NOTE — Telephone Encounter (Signed)
Saifan Zuo ?[phone] ? ?[name] called and said last weekend he woke up in the middle of the night feeling ill, and fainted, when he woke up he was on the floor. He had hit his arm, he said he had 1 drink, he is concerned, so he would like to come in one day  this week. ?

## 2021-11-26 ENCOUNTER — Other Ambulatory Visit: Payer: Self-pay

## 2021-11-26 ENCOUNTER — Ambulatory Visit (INDEPENDENT_AMBULATORY_CARE_PROVIDER_SITE_OTHER): Payer: BC Managed Care – PPO | Admitting: Internal Medicine

## 2021-11-26 ENCOUNTER — Encounter: Payer: Self-pay | Admitting: Internal Medicine

## 2021-11-26 VITALS — Ht 68.0 in | Wt 196.8 lb

## 2021-11-26 DIAGNOSIS — R55 Syncope and collapse: Secondary | ICD-10-CM | POA: Diagnosis not present

## 2021-11-26 DIAGNOSIS — Z1211 Encounter for screening for malignant neoplasm of colon: Secondary | ICD-10-CM

## 2021-11-26 DIAGNOSIS — S0990XA Unspecified injury of head, initial encounter: Secondary | ICD-10-CM | POA: Diagnosis not present

## 2021-11-26 DIAGNOSIS — Z136 Encounter for screening for cardiovascular disorders: Secondary | ICD-10-CM | POA: Diagnosis not present

## 2021-11-26 LAB — POCT URINALYSIS DIPSTICK
Bilirubin, UA: NEGATIVE
Blood, UA: NEGATIVE
Glucose, UA: NEGATIVE
Ketones, UA: NEGATIVE
Leukocytes, UA: NEGATIVE
Nitrite, UA: NEGATIVE
Protein, UA: NEGATIVE
Spec Grav, UA: <=1.005 — AB (ref 1.010–1.025)
Urobilinogen, UA: 0.2 E.U./dL
pH, UA: 6 (ref 5.0–8.0)

## 2021-11-26 NOTE — Progress Notes (Signed)
Orthostatic Vitals for the past 48 hrs (Last 6 readings): ? Patient Position Orthostatic BP Orthostatic Pulse  ?11/26/21 0955 Supine 112/70 70  ?11/26/21 0959 Sitting 110/70 82  ?11/26/21 1000 Standing 114/72 81  ?  ?

## 2021-11-26 NOTE — Progress Notes (Signed)
? ?  Subjective:  ? ? Patient ID: Brad Holland, male    DOB: November 01, 1975, 46 y.o.   MRN: [id number] ? 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 ? ? Patient ate pork tenderloin and asparagus. He  had one cocktail with bourbon. Awakened around 2-3 am and needed to urinate. Felt nauseated in BR. Did not vomit. Became diaphoretic. Leaned up against wall, felt fatigued, and had syncopal episode. Wife did not hear him. Had painful scratches on back. Does not recall falling.Had no contusions on his head. Came to and recalled what had happened. No more episodes since then. Never had syncope before. ? ?Hx of migraine headaches and takes Maxalt. ? ?History of bilateral tinnitus with normal hearing seen by Dr. Erik Obey December 0263. ? ?He has a history of dysphonia and struggled with that for some time but this has improved.  He had upper endoscopy by Dr. Erskine Emery in 2015 for hoarseness.  That study was normal. ? ?Social history: He is married.  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  He is a former smoker.  Social alcohol consumption.  2 children. ? ?Family history: History of bladder cancer in father. ? ? ? ?Review of Systems has felt fine since syncopal episode.  Able to teach.  No issues with memory or recall.  No headaches. ? ?   ?Objective:  ? Physical Exam ?Vital signs reviewed.  Orthostatic blood pressures are reviewed and are within normal limits.  4 mm drop from standing to sitting.  Pulse oximetry is normal.  Pulse is 70-82 and regular.  EKG is within normal limits. ?Skin warm and dry.  No cervical adenopathy.  TMs clear.  Chest clear.  Cardiac exam: Regular rate and rhythm without ectopy or murmurs.  No gallops.  No lower extremity pitting edema.  Neurological exam is intact without gross focal deficits.  Affect thought and judgment appear to be normal. ? ? ?   ?Assessment & Plan:   ?Suspect vasovagal syncope.  However, he is concerned about this.  I doubt there is a cardiac etiology for this.  EKG is within normal limits but we can send him for coronary calcium score and evaluation by Cardiologist ? ?History of anxiety-worries about his health ? ?History of migraine headaches treated with Maxalt.  None recently. ? ?Plan: Labs drawn today include CBC c-Met sed rate free T4 and TSH.  Since this was so disconcerting to him I will have him evaluated by Neurology and Cardiology. ? ?

## 2021-11-26 NOTE — Patient Instructions (Signed)
Labs drawn and pending.  EKG is within normal limits.  I suspect you had vasovagal syncope perhaps due to volume depletion or other factors. ? ?To be safe, and to allay concerns, we will have you evaluated by Cardiology and Neurology ?

## 2021-11-27 LAB — CBC WITH DIFFERENTIAL/PLATELET
Absolute Monocytes: 388 cells/uL (ref 200–950)
Basophils Absolute: 10 cells/uL (ref 0–200)
Basophils Relative: 0.2 %
Eosinophils Absolute: 117 cells/uL (ref 15–500)
Eosinophils Relative: 2.3 %
HCT: 50.1 % — ABNORMAL HIGH (ref 38.5–50.0)
Hemoglobin: 17.2 g/dL — ABNORMAL HIGH (ref 13.2–17.1)
Lymphs Abs: 1581 cells/uL (ref 850–3900)
MCH: 32.4 pg (ref 27.0–33.0)
MCHC: 34.3 g/dL (ref 32.0–36.0)
MCV: 94.4 fL (ref 80.0–100.0)
MPV: 12.8 fL — ABNORMAL HIGH (ref 7.5–12.5)
Monocytes Relative: 7.6 %
Neutro Abs: 3004 cells/uL (ref 1500–7800)
Neutrophils Relative %: 58.9 %
Platelets: 133 10*3/uL — ABNORMAL LOW (ref 140–400)
RBC: 5.31 10*6/uL (ref 4.20–5.80)
RDW: 12.9 % (ref 11.0–15.0)
Total Lymphocyte: 31 %
WBC: 5.1 10*3/uL (ref 3.8–10.8)

## 2021-11-27 LAB — SEDIMENTATION RATE: Sed Rate: 2 mm/h (ref 0–15)

## 2021-11-27 LAB — COMPLETE METABOLIC PANEL WITH GFR
AG Ratio: 1.9 (calc) (ref 1.0–2.5)
ALT: 16 U/L (ref 9–46)
AST: 20 U/L (ref 10–40)
Albumin: 4.3 g/dL (ref 3.6–5.1)
Alkaline phosphatase (APISO): 45 U/L (ref 36–130)
BUN: 14 mg/dL (ref 7–25)
CO2: 29 mmol/L (ref 20–32)
Calcium: 9.5 mg/dL (ref 8.6–10.3)
Chloride: 100 mmol/L (ref 98–110)
Creat: 0.99 mg/dL (ref 0.60–1.29)
Globulin: 2.3 g/dL (calc) (ref 1.9–3.7)
Glucose, Bld: 73 mg/dL (ref 65–139)
Potassium: 4.4 mmol/L (ref 3.5–5.3)
Sodium: 139 mmol/L (ref 135–146)
Total Bilirubin: 0.6 mg/dL (ref 0.2–1.2)
Total Protein: 6.6 g/dL (ref 6.1–8.1)
eGFR: 95 mL/min/{1.73_m2} (ref >=60)

## 2021-11-27 LAB — TSH: TSH: 1.7 mIU/L (ref 0.40–4.50)

## 2021-11-27 LAB — T4, FREE: Free T4: 1 ng/dL (ref 0.8–1.8)

## 2021-11-29 ENCOUNTER — Other Ambulatory Visit: Payer: Self-pay

## 2021-11-29 DIAGNOSIS — S0990XA Unspecified injury of head, initial encounter: Secondary | ICD-10-CM

## 2021-12-04 ENCOUNTER — Telehealth: Payer: Self-pay

## 2021-12-04 NOTE — Telephone Encounter (Signed)
Patient is unsure if he is supposed to see Neurology since we got the CT approved. Please advise if this is still needed.  ?

## 2021-12-04 NOTE — Telephone Encounter (Signed)
Detailed message left on identifiable machine belonging to the patient. Advised to call with any questions or concerns.   

## 2021-12-09 ENCOUNTER — Other Ambulatory Visit: Payer: Self-pay

## 2021-12-09 ENCOUNTER — Ambulatory Visit (INDEPENDENT_AMBULATORY_CARE_PROVIDER_SITE_OTHER): Payer: BC Managed Care – PPO | Admitting: Internal Medicine

## 2021-12-09 ENCOUNTER — Encounter: Payer: Self-pay | Admitting: Internal Medicine

## 2021-12-09 VITALS — BP 118/74 | HR 67 | Ht 68.0 in | Wt 197.8 lb

## 2021-12-09 DIAGNOSIS — R55 Syncope and collapse: Secondary | ICD-10-CM | POA: Diagnosis not present

## 2021-12-09 NOTE — Patient Instructions (Signed)

## 2021-12-09 NOTE — Progress Notes (Signed)
?Cardiology Office Note:   ? ?Date:  12/09/2021  ? ?ID:  Brad Holland, DOB 06/18/1976, MRN [id number] ? ?PCP:  Margaree Mackintosh, MD ?  ?[facility] Providers ?Cardiologist:  Maisie Fus, MD    ? ?Referring MD: Margaree Mackintosh, MD  ? ?No chief complaint on file. ?Syncope ? ?History of Present Illness:   ? ?Brad Holland is a 46 y.o. male with a hx of migraine, referral for syncope ? ?He notes getting up to go to the bathroom, felt sick and nauseous. Noted he had 1 drink that night. He had a cold sweat. He noted loosing consciousness. He was by himself. No history in the past. No heart disease hx. He has had normal EKGs. He had a normal echo in 2013. He notes he was having anxiety. He quit smoking 15 years. ? ?Family Hx: parents no hx of heart disease. No SCD. ? ?He's scheduled CAC 12/11/2021 ? ?11/26/2021: NSR ? ?10/02/2011-Normal LV function. RV function. No valve dx ? ?Past Medical History:  ?Diagnosis Date  ? Anal fissure   ? Anxiety   ? Complication of anesthesia   ? sensitivity to eggs  ? Depression   ? Migraine headache   ? sporadic ? 1-2 per week  ? ? ?Past Surgical History:  ?Procedure Laterality Date  ? BRAVO PH STUDY N/A 08/07/2014  ? Procedure: BRAVO PH STUDY;  Surgeon: Louis Meckel, MD;  Location: [facility];  Service: Endoscopy;  Laterality: N/A;  ? ESOPHAGOGASTRODUODENOSCOPY (EGD) WITH PROPOFOL N/A 08/07/2014  ? Procedure: ESOPHAGOGASTRODUODENOSCOPY (EGD) WITH PROPOFOL;  Surgeon: Louis Meckel, MD;  Location: [facility];  Service: Endoscopy;  Laterality: N/A;  ? TONSILLECTOMY    ? ? ?Current Medications: ?Current Meds  ?Medication Sig  ? b complex vitamins tablet Take by mouth.  ? clonazePAM (KLONOPIN) 0.5 MG tablet   ? clotrimazole-betamethasone (LOTRISONE) cream APPLY TO AFFECTED AREA TWICE A DAY.  ? escitalopram (LEXAPRO) 20 MG tablet Take 20 mg by mouth daily.  ? IBU 600 MG tablet TAKE ONE TABLET BY MOUTH EVERY 8 HOURS AS NEEDED  ? Lactobacillus Rhamnosus, GG, (CULTURELLE) CAPS Take by  mouth.  ? Multiple Vitamin (MULTI-VITAMINS) TABS Take by mouth.  ? ondansetron (ZOFRAN) 4 MG tablet Take 1 tablet (4 mg total) by mouth every 8 (eight) hours as needed for nausea or vomiting.  ? Riboflavin (VITAMIN B-2) 25 MG TABS Take by mouth.  ? rizatriptan (MAXALT) 10 MG tablet Take 1 tablet (10 mg total) by mouth as needed for migraine. May repeat in 2 hours if needed  ?  ? ?Allergies:   Cortisone and Eggs or egg-derived products  ? ?Social History  ? ?Socioeconomic History  ? Marital status: Married  ?  Spouse name: Not on file  ? Number of children: 2  ? Years of education: Not on file  ? Highest education level: Not on file  ?Occupational History  ? Occupation: professor  ?  Employer: [employer]  ?Tobacco Use  ? Smoking status: Former  ?  Types: Cigarettes  ?  Quit date: 06/07/2009  ?  Years since quitting: 12.5  ? Smokeless tobacco: Never  ?Substance and Sexual Activity  ? Alcohol use: No  ? Drug use: No  ? Sexual activity: Yes  ?Other Topics Concern  ? Not on file  ?Social History Narrative  ? Not on file  ? ?Social Determinants of Health  ? ?Financial Resource Strain: Not on file  ?Food Insecurity: Not on file  ?  Transportation Needs: Not on file  ?Physical Activity: Not on file  ?Stress: Not on file  ?Social Connections: Not on file  ?  ? ?Family History: ?The patient's family history includes Bladder Cancer in his father; Irritable bowel syndrome in his mother. ? ?ROS:   ?Please see the history of present illness.    ? All other systems reviewed and are negative. ? ?EKGs/Labs/Other Studies Reviewed:   ? ?The following studies were reviewed today: ? ? ?EKG:  EKG is  ordered today.  The ekg ordered today demonstrates  ? ?NSR, normal PR, QTc ? ?Recent Labs: ?11/26/2021: ALT 16; BUN 14; Creat 0.99; Hemoglobin 17.2; Platelets 133; Potassium 4.4; Sodium 139; TSH 1.70  ?Recent Lipid Panel ?   ?Component Value Date/Time  ? CHOL 186 02/08/2021 1058  ? TRIG 142 02/08/2021 1058  ? HDL 48 02/08/2021 1058  ? CHOLHDL  3.9 02/08/2021 1058  ? VLDL 29 08/29/2011 0945  ? LDLCALC 113 (H) 02/08/2021 1058  ? ? ? ?Risk Assessment/Calculations:   ?  ? ?    ? ?Physical Exam:   ? ?VS:   ?Vitals:  ? 12/09/21 1106  ?BP: 118/74  ?Pulse: 67  ?SpO2: 99%  ? ? ? ?Wt Readings from Last 3 Encounters:  ?12/09/21 197 lb 12.8 oz (89.7 kg)  ?11/26/21 196 lb 12 oz (89.2 kg)  ?08/26/21 191 lb (86.6 kg)  ?  ? ?GEN:  Well nourished, well developed in no acute distress ?HEENT: Normal ?NECK: No JVD; No carotid bruits ?LYMPHATICS: No lymphadenopathy ?CARDIAC: RRR, no murmurs, rubs, gallops ?RESPIRATORY:  Clear to auscultation without rales, wheezing or rhonchi  ?ABDOMEN: Soft, non-tender, non-distended ?MUSCULOSKELETAL:  No edema; No deformity  ?SKIN: Warm and dry ?NEUROLOGIC:  Alert and oriented x 3 ?PSYCHIATRIC:  Normal affect  ? ?ASSESSMENT:   ? ?#Vasovagal syncope: He does not have high risk features including syncope c/f arrhythmia , family hx of SCD, or abnormalities on his EKG. Symptoms associated with vasovagal syncope with prodrome. We discussed knowing triggers, and lowering to the floor if he notes these triggers, as well as staying hydrated. ? ?PLAN:   ? ?In order of problems listed above: ? ?Follow up PRN ? ?   ? ?  ? ?Medication Adjustments/Labs and Tests Ordered: ?Current medicines are reviewed at length with the patient today.  Concerns regarding medicines are outlined above.  ?No orders of the defined types were placed in this encounter. ? ?No orders of the defined types were placed in this encounter. ? ? ?Patient Instructions  ?Medication Instructions:  ?No Changes In Medications at this time.  ?*If you need a refill on your cardiac medications before your next appointment, please call your pharmacy* ? ?Follow-Up: ?At [facility], you and your health needs are our priority.  As part of our continuing mission to provide you with exceptional heart care, we have created designated Provider Care Teams.  These Care Teams include your primary  Cardiologist (physician) and Advanced Practice Providers (APPs -  Physician Assistants and Nurse Practitioners) who all work together to provide you with the care you need, when you need it. ? ?Your next appointment:   ?AS NEEDED  ? ?The format for your next appointment:   ?In Person ? ?Provider:   ?Maisie Fus, MD   ?  ? ?Signed, ?Maisie Fus, MD  ?12/09/2021 11:29 AM    ?[facility] ?

## 2021-12-11 ENCOUNTER — Other Ambulatory Visit: Payer: Self-pay

## 2021-12-11 ENCOUNTER — Ambulatory Visit (HOSPITAL_COMMUNITY)
Admission: RE | Admit: 2021-12-11 | Discharge: 2021-12-11 | Disposition: A | Discharge status: Home / Self Care | Payer: Self-pay | Source: Ambulatory Visit | Attending: Internal Medicine | Admitting: Internal Medicine

## 2021-12-11 DIAGNOSIS — Z136 Encounter for screening for cardiovascular disorders: Secondary | ICD-10-CM | POA: Insufficient documentation

## 2021-12-13 ENCOUNTER — Telehealth: Payer: Self-pay

## 2021-12-13 NOTE — Telephone Encounter (Signed)
Left detailed message for patient in regards to scheduling his CT prior to exp date of referral of 4/5. ?

## 2021-12-20 ENCOUNTER — Other Ambulatory Visit: Payer: BC Managed Care – PPO

## 2021-12-20 ENCOUNTER — Telehealth: Payer: Self-pay | Admitting: Internal Medicine

## 2021-12-20 NOTE — Telephone Encounter (Signed)
Brad Holland ?[phone] ? ?Brad Holland is calling to see if you think it is really necessary for him to have CT today, because it is going to cost over $400.00 out of pocket. He will have if you think necessary. ?

## 2021-12-20 NOTE — Telephone Encounter (Signed)
Called patient to let him know what Dr Lenord Fellers said, and he thinks he will cancel CT for now. ?

## 2022-01-01 ENCOUNTER — Other Ambulatory Visit: Payer: BC Managed Care – PPO

## 2022-01-29 ENCOUNTER — Telehealth: Payer: Self-pay | Admitting: Internal Medicine

## 2022-01-29 NOTE — Telephone Encounter (Signed)
Brad Holland ?[phone] ? ?[name] called to say he has caught what Natalia Leatherwood has, been sick for the last 5 days with Charlotta Newton throat,pink eye,excessive coughing, runny nose, and general congestion, COVID test negative yesterday.  ?He said that she stated she should have come in sooner and maybe tried to see what virus she had, so he is wandering if he should do that. Should I schedule him for an appointment? ?

## 2022-01-30 ENCOUNTER — Encounter: Payer: Self-pay | Admitting: Internal Medicine

## 2022-01-30 ENCOUNTER — Ambulatory Visit: Payer: BC Managed Care – PPO | Admitting: Internal Medicine

## 2022-01-30 VITALS — BP 106/78 | HR 74 | Temp 97.4°F

## 2022-01-30 DIAGNOSIS — J22 Unspecified acute lower respiratory infection: Secondary | ICD-10-CM

## 2022-01-30 MED ORDER — METHYLPREDNISOLONE ACETATE 80 MG/ML IJ SUSP
80.0000 mg | Freq: Once | INTRAMUSCULAR | Status: AC
  Administered 2022-01-30: 80 mg via INTRAMUSCULAR

## 2022-01-30 MED ORDER — BENZONATATE 100 MG PO CAPS: 100.0000 mg | ORAL_CAPSULE | Freq: Three times a day (TID) | ORAL | 0 refills | Status: DC | PRN

## 2022-01-30 MED ORDER — AZITHROMYCIN 250 MG PO TABS: ORAL_TABLET | ORAL | 0 refills | Status: AC

## 2022-01-30 NOTE — Progress Notes (Signed)
   Subjective:    Patient ID: Brad Holland, male    DOB: [birth year], 46 y.o.   MRN: [id number]  HPI Patient seen today with respiratory infection symptoms for 5 days.  Has had laryngitis, sore throat, pinkeye, coughing, rhinorrhea, malaise and fatigue.  COVID test yesterday was negative.  Wife has been ill with viral syndrome and respiratory symptoms and was seen here recently as well.  Had vasovagal syncope recently and was seen by cardiologist who agreed with that diagnosis.  Review of Systems non productive cough with some sputum production.  Has had some conjunctivitis symptoms as well     Objective:   Physical Exam Temperature 97.4 degrees by ear thermometer pulse 74 regular blood pressure 106/78  Skin: Warm and dry.  No cervical adenopathy.  TMs are clear.  Neck supple.  Chest clear.  Pharynx is without exudate       Assessment & Plan:  Acute respiratory infection  Plan: Ocuflox ophthalmic drops 2 drops in each eye 4 times a day for 5 to 7 days if needed for conjunctivitis.  Take Zithromax Z-PAK 2 tabs day 1 followed by 1 tab days 2 through 5.  Tessalon Perles 100 mg 3 times daily if needed for cough.  Was given Depo-Medrol 80 mg IM once in the office to help with respiratory congestion.

## 2022-01-30 NOTE — Telephone Encounter (Signed)
Appt today at 4:30

## 2022-01-30 NOTE — Patient Instructions (Addendum)
Zithromax Z-pak 2 tabs day 1 followed by one tab days 2-5. Tessalon perles one tab 3 times a day as needed for cough.  Ocuflox ophthalmic drops 2 drops in each eye 4 times a day for 5 to 7 days if needed for conjunctivitis.  Depo-Medrol 80 mg IM.Marland Kitchen

## 2022-01-31 MED ORDER — OFLOXACIN 0.3 % OP SOLN: 2.0000 [drp] | Freq: Four times a day (QID) | OPHTHALMIC | 0 refills | Status: DC

## 2022-01-31 NOTE — Telephone Encounter (Signed)
[name] called back to say when he went to pharmacy he did not have any eye drops, so he wanted to see if he could get some called in today. ?

## 2022-01-31 NOTE — Addendum Note (Signed)
Addended by: Angus Seller on: 01/31/2022 10:45 AM ? ? Modules accepted: Orders ? ?

## 2022-01-31 NOTE — Telephone Encounter (Signed)
Medication sent in. 

## 2022-02-20 ENCOUNTER — Telehealth: Payer: Self-pay | Admitting: Internal Medicine

## 2022-02-20 NOTE — Telephone Encounter (Signed)
Brad Holland [phone]  Belenda Cruise called to say that Brad Holland is still sick he has really bad cough and headache, so I scheduled him to come in tomorrow afternoon, he could not come in today.

## 2022-02-21 ENCOUNTER — Encounter: Payer: Self-pay | Admitting: Internal Medicine

## 2022-02-21 ENCOUNTER — Ambulatory Visit: Payer: BC Managed Care – PPO | Admitting: Internal Medicine

## 2022-02-21 VITALS — BP 118/78 | HR 72 | Temp 97.7°F

## 2022-02-21 DIAGNOSIS — Z1383 Encounter for screening for respiratory disorder NEC: Secondary | ICD-10-CM | POA: Diagnosis not present

## 2022-02-21 DIAGNOSIS — R059 Cough, unspecified: Secondary | ICD-10-CM | POA: Diagnosis not present

## 2022-02-21 DIAGNOSIS — J22 Unspecified acute lower respiratory infection: Secondary | ICD-10-CM

## 2022-02-21 MED ORDER — METHYLPREDNISOLONE 4 MG PO TABS: ORAL_TABLET | ORAL | 0 refills | Status: DC

## 2022-02-21 MED ORDER — CEFTRIAXONE SODIUM 1 G IJ SOLR
1.0000 g | Freq: Once | INTRAMUSCULAR | Status: AC
  Administered 2022-02-21: 1 g via INTRAMUSCULAR

## 2022-02-21 NOTE — Progress Notes (Signed)
   Subjective:    Patient ID: Brad Holland, male    DOB: Dec 27, 1975, 46 y.o.   MRN: [id number]  HPI He continues to struggle with productive cough with discolored sputum, headache, malaise and fatigue.  His wife has similar symptoms.  He was seen May 11 with an acute respiratory infection that has been present for 5 days.  He had laryngitis, sore throat, conjunctivitis, coughing, rhinorrhea, malaise and fatigue.  COVID test at home was negative.  Recently evaluated by Cardiologist who agreed that he had vasovagal syncope in March.  He was reassured about this.  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  His general health is excellent.    Review of Systems complains of cough, headache malaise and fatigue     Objective:   Physical Exam Vital signs reviewed.  He is afebrile.  Pulse oximetry 96% on room air.  Temperature 97.7 degrees pulse 72 regular blood pressure 118/78.  Skin: Warm and dry.  No cervical adenopathy.  TMs clear.  Chest is clear without rales or wheezing.  Pharynx is clear.       Assessment & Plan:   Protracted respiratory infection-likely viral syndrome.  May have element of secondary bacterial infection at this point  Plan: In May he was treated with Zithromax Z-PAK and that did not help.  He was also prescribed Tessalon Perles.  Today he was given Rocephin 1 g IM one-time dose.  He was also given Medrol 4 mg tablets to take in tapering course as directed starting with 6 tablets day 1 and decreasing by 1 tablet daily.  6-5-4-3-2-1 taper.  Hopefully this will help clear up his congestion.  He will let me know if not better in 5 to 7 days or sooner if worse.  Respiratory virus panel was negative.

## 2022-02-25 LAB — RESPIRATORY VIRUS PANEL

## 2022-03-19 ENCOUNTER — Encounter: Payer: Self-pay | Admitting: Internal Medicine

## 2022-03-19 NOTE — Patient Instructions (Addendum)
Rocephin 1 g IM given in office.  Take Medrol 4 mg 6-day Dosepak taper as directed.  Call if not better in 5 to 7 days or sooner if worse.  Respiratory virus panel was negative.

## 2022-03-20 ENCOUNTER — Other Ambulatory Visit: Payer: Self-pay | Admitting: Internal Medicine

## 2022-03-21 ENCOUNTER — Other Ambulatory Visit: Payer: Self-pay | Admitting: Internal Medicine

## 2022-06-18 IMAGING — CT CT CARDIAC CORONARY ARTERY CALCIUM SCORE
2 series · 15 of 20 positions shown, 17 images · non-contrast
Comparison: None.
COMPARISON: None.

Addendum:
EXAM:
OVER-READ INTERPRETATION  CT CHEST

The following report is an over-read performed by radiologist Dr.
Maria Luci Mattei [REDACTED] on 12/11/2021. This over-read
does not include interpretation of cardiac or coronary anatomy or
pathology. The coronary calcium score interpretation by the
cardiologist is attached.
CLINICAL DATA: Risk stratification: 45 Year-old White Male
Coronary Calcium Score
TECHNIQUE: The patient was scanned on a Siemens Force scanner. Axial
non-contrast 3 mm slices were carried out through the heart. The
data set was analyzed on a dedicated work station and scored using
the Agatson method.

[Series 3: hrt calcium · axial · 0.47mm/px · z∈[-343,-226]mm · 8 of 51 slices shown, 10 images]
[im 6/51  vessel]
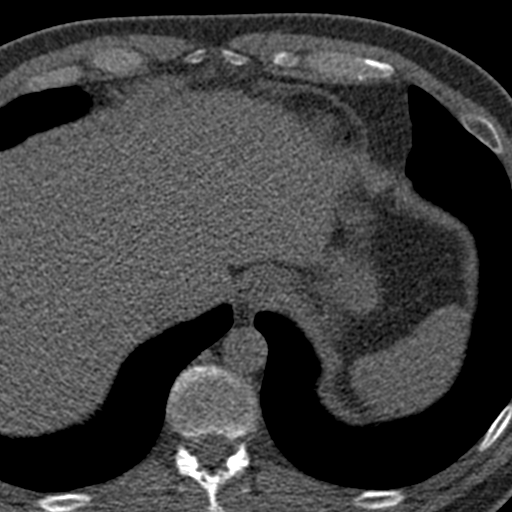
[im 6/51  lung]
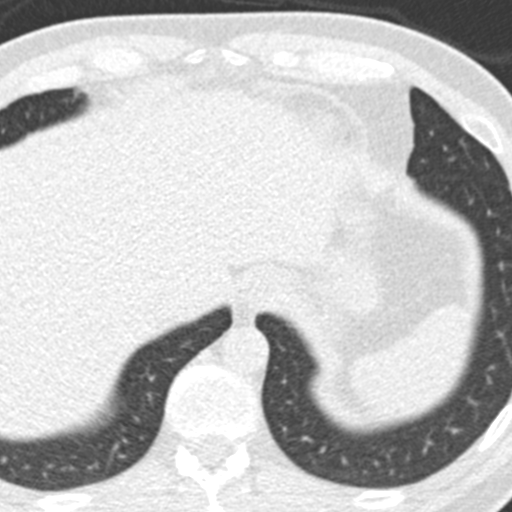
[im 12/51  vessel]
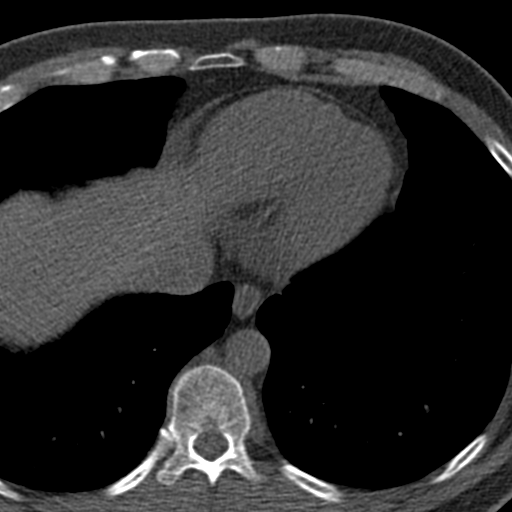
[im 17/51  vessel]
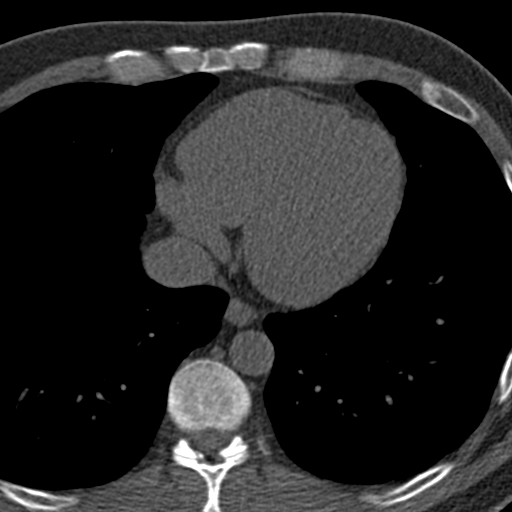
[im 23/51  vessel]
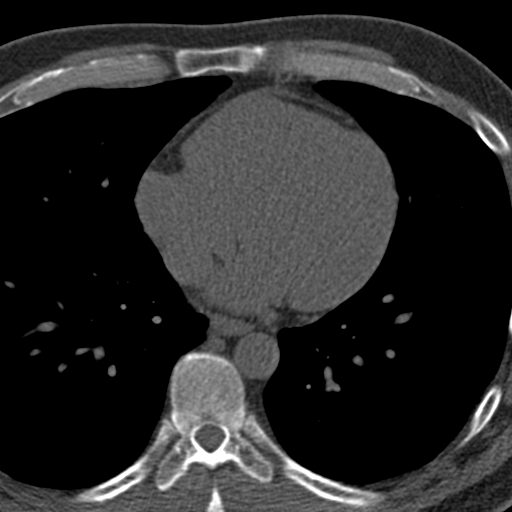
[im 28/51  vessel]
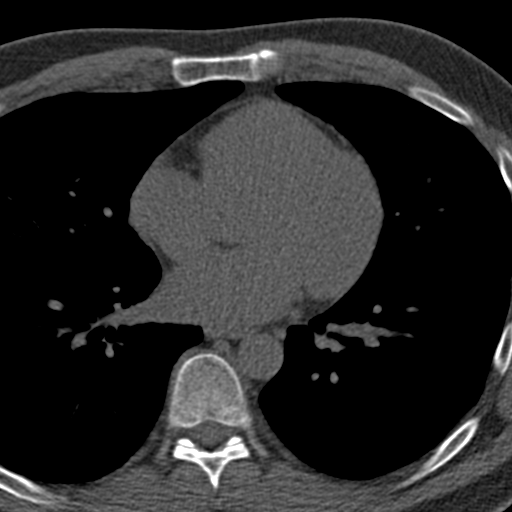
[im 28/51  lung]
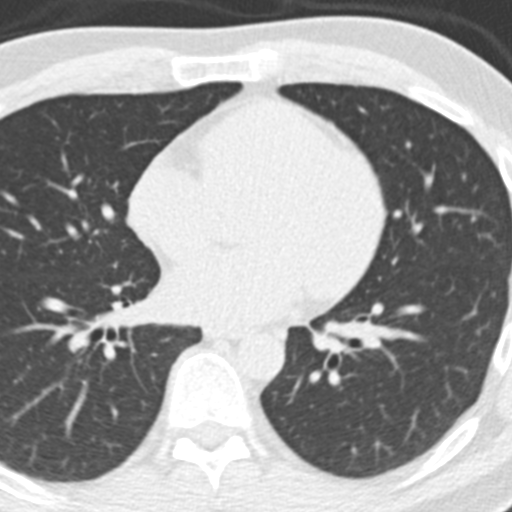
[im 34/51  vessel]
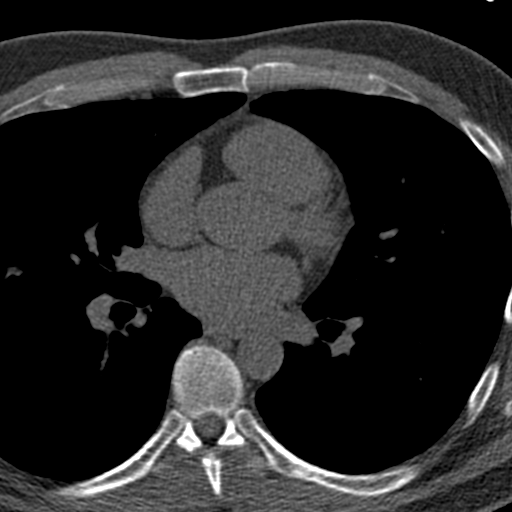
[im 39/51  vessel]
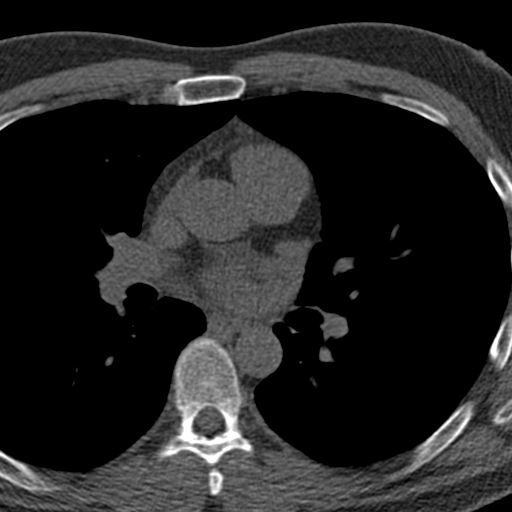
[im 45/51  vessel]
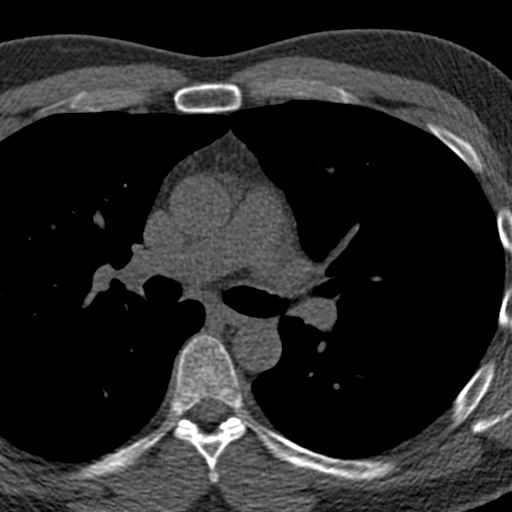

[Series 4: soft full fov 69 % · axial · 0.82mm/px · z∈[-343,-244]mm · 7 of 51 slices shown]
[im 6/51  vessel]
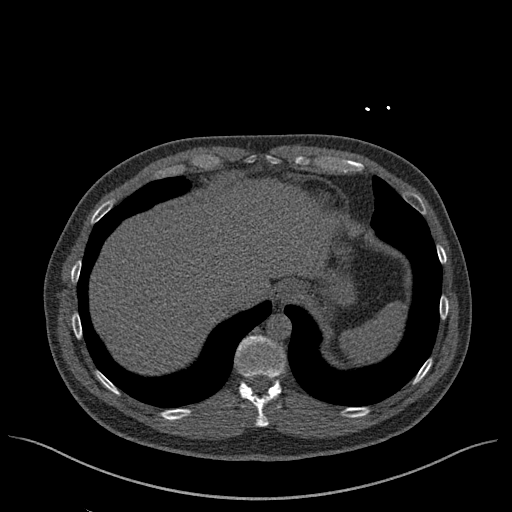
[im 12/51  vessel]
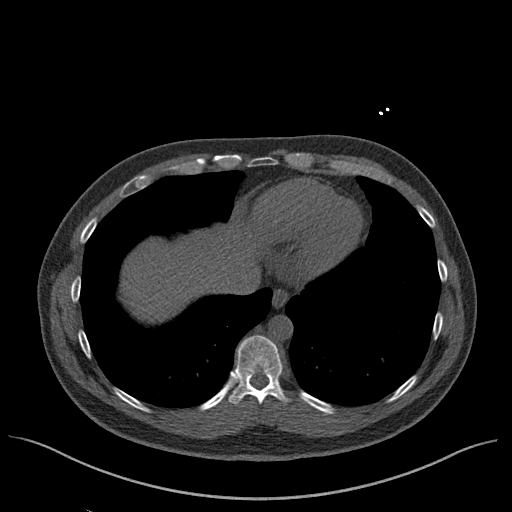
[im 17/51  vessel]
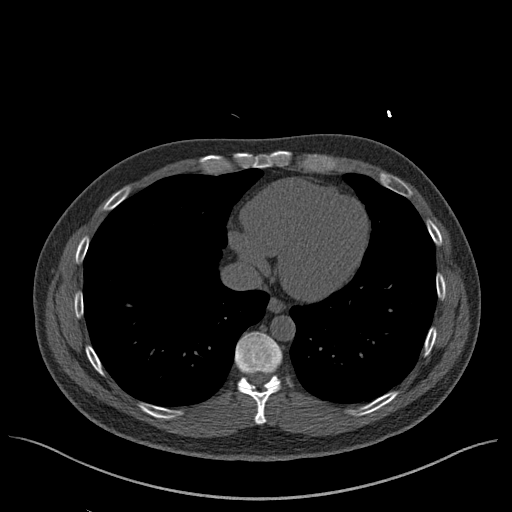
[im 23/51  vessel]
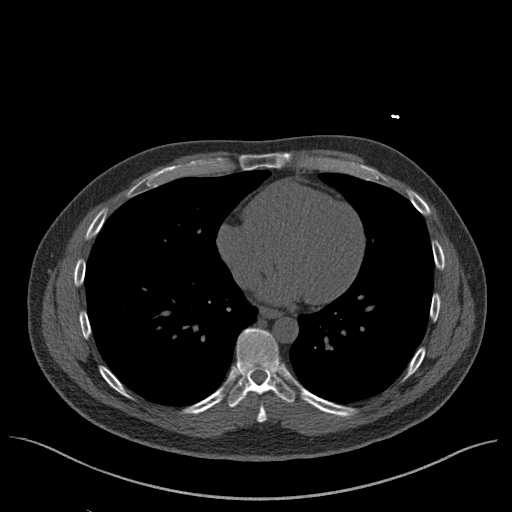
[im 28/51  vessel]
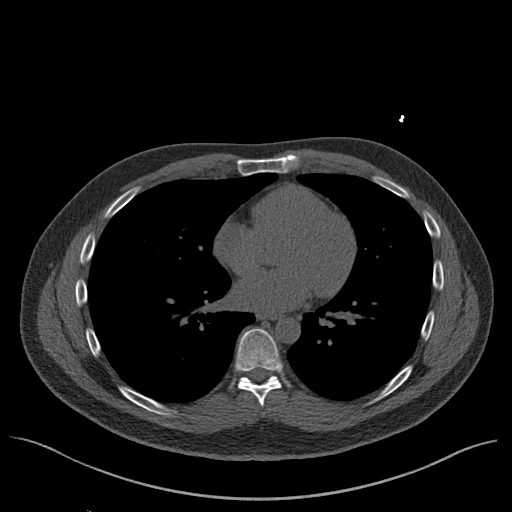
[im 34/51  vessel]
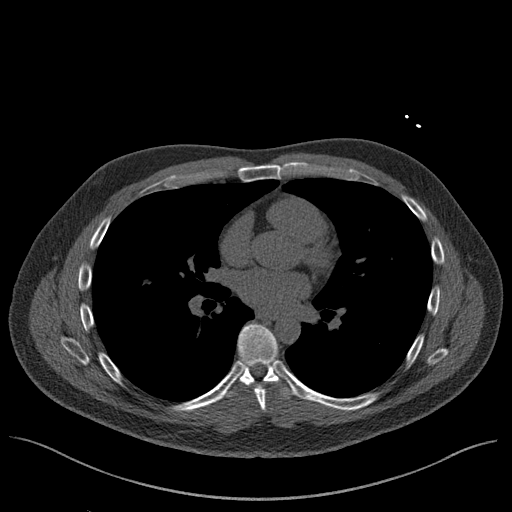
[im 39/51  vessel]
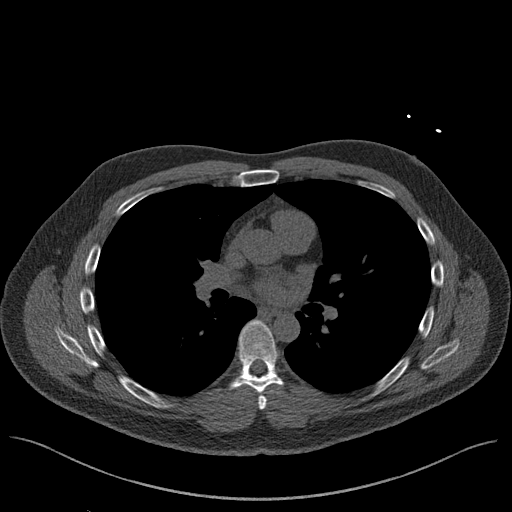

[15 of 20 positions shown; findings below may reference images not displayed]

FINDINGS: Vascular: Normal caliber of the visualized thoracic aorta.

Mediastinum/Nodes: Visualized mediastinal structures are normal.

Lungs/Pleura: Visualized lungs are clear.

Upper Abdomen: Images of the upper abdomen are unremarkable.

Musculoskeletal: No acute bone abnormality.
IMPRESSION: Negative over-read.  No acute extracardiac findings.
FINDINGS: Non-cardiac: See separate report from [REDACTED].

Ascending Aorta: Normal caliber.

Pericardium: Normal.

Coronary arteries: Normal origins.

Coronary Calcium Score:

Left main: 0

Left anterior descending artery: 0

Left circumflex artery: 0

Right coronary artery: 0

Total: 0

Percentile: 1st for age, sex, and race matched control.
IMPRESSION: 1. Coronary calcium score of 0. This was 1st percentile for age,
gender, and race matched controls.

RECOMMENDATIONS:



If CAC = 0, it is reasonable to withhold statin therapy and reassess
in 5 to 10 years, as long as higher risk conditions are absent
(diabetes mellitus, family history of premature CHD in first degree
relatives (males <55 years; females <65 years), cigarette smoking,
LDL >=190 mg/dL or other independent risk factors).

If CAC is 1 to 99, it is reasonable to initiate statin therapy for
patients >=55 years of age.

If CAC is >=100 or >=75th percentile, it is reasonable to initiate
statin therapy at any age.

Cardiology referral should be considered for patients with CAC
scores =400 or >=75th percentile.

*2040 AHA/ACC/AACVPR/AAPA/ABC/GOMEZ/ROBERTO/MAINOR/Amnon/UYE/NIELSEN/TIGER
Guideline on the Management of Blood Cholesterol: A Report of the
American College of Cardiology/American Heart Association Task Force
on Clinical Practice Guidelines. J Am Coll Cardiol.
3791;73(24):1511-1408.

*** End of Addendum ***
EXAM:
OVER-READ INTERPRETATION  CT CHEST

The following report is an over-read performed by radiologist Dr.
Maria Luci Mattei [REDACTED] on 12/11/2021. This over-read
does not include interpretation of cardiac or coronary anatomy or
pathology. The coronary calcium score interpretation by the
cardiologist is attached.
FINDINGS: Vascular: Normal caliber of the visualized thoracic aorta.

Mediastinum/Nodes: Visualized mediastinal structures are normal.

Lungs/Pleura: Visualized lungs are clear.

Upper Abdomen: Images of the upper abdomen are unremarkable.

Musculoskeletal: No acute bone abnormality.
IMPRESSION: Negative over-read.  No acute extracardiac findings.

## 2022-11-05 ENCOUNTER — Other Ambulatory Visit: Payer: Self-pay | Admitting: Internal Medicine

## 2022-11-14 ENCOUNTER — Encounter: Payer: Self-pay | Admitting: Internal Medicine

## 2022-11-14 ENCOUNTER — Telehealth: Payer: Self-pay | Admitting: Internal Medicine

## 2022-11-14 ENCOUNTER — Ambulatory Visit: Payer: BC Managed Care – PPO | Admitting: Internal Medicine

## 2022-11-14 VITALS — BP 98/62 | HR 71 | Temp 98.3°F | Ht 68.0 in | Wt 206.8 lb

## 2022-11-14 DIAGNOSIS — H6501 Acute serous otitis media, right ear: Secondary | ICD-10-CM

## 2022-11-14 DIAGNOSIS — H9201 Otalgia, right ear: Secondary | ICD-10-CM

## 2022-11-14 MED ORDER — METHYLPREDNISOLONE ACETATE 80 MG/ML IJ SUSP
80.0000 mg | Freq: Once | INTRAMUSCULAR | Status: AC
  Administered 2022-11-14: 80 mg via INTRAMUSCULAR

## 2022-11-14 NOTE — Telephone Encounter (Signed)
Arvind Seay [phone]  [name] has 3 things he needs to be seen for.  1-- right ear hurting, ringing and feels blocked. -- No other symptoms this has been going on for about 48 hours.  2-- chest feels heavy -- he feels it is anxiety  3-- Having trouble getting name brand MAXALT-MLT 10 MG disintegrating tablet   He would like to be seen this afternoon if at all possible, he said he could be here in 5 minutes.

## 2022-11-14 NOTE — Progress Notes (Signed)
Patient Care Team: Elby Showers, MD as PCP - General (Internal Medicine) Janina Mayo, MD as PCP - Cardiology (Cardiology)  Visit Date: 11/14/22  Subjective:    Patient ID: Brad Holland , Male   DOB: Mar 12, 1976, 47 y.o.    MRN: [id number]   47 y.o. Male presents today for right ear pain, chest tightness. Patient has a past medical history of anal fissure, anxiety, depression, migraine headaches.  History of tinnitus. Woke up with clogged right ear on 11/12/22. Sensitive to loud noises. Denies having a cold now or recently.  Having trouble getting Maxalt refilled. Has tried generic but says it makes headaches worse. Wants the brand.   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 1-2 per week     Family History  Problem Relation Age of Onset   Bladder Cancer Father    Irritable bowel syndrome Mother     Social History   Social History Narrative   Not on file      Review of Systems  Constitutional:  Negative for fever and malaise/fatigue.  HENT:  Positive for ear pain (Right). Negative for congestion.   Eyes:  Negative for blurred vision.  Respiratory:  Negative for cough and shortness of breath.   Cardiovascular:  Negative for chest pain, palpitations and leg swelling.  Gastrointestinal:  Negative for vomiting.  Musculoskeletal:  Negative for back pain.  Skin:  Negative for rash.  Neurological:  Negative for loss of consciousness and headaches.        Objective:   Vitals: BP 98/62   Pulse 71   Temp 98.3 F (36.8 C) (Tympanic)   Ht '5\' 8"'$  (1.727 m)   Wt 206 lb 12.8 oz (93.8 kg)   SpO2 98%   BMI 31.44 kg/m    Physical Exam Vitals and nursing note reviewed.  Constitutional:      General: He is not in acute distress.    Appearance: Normal appearance. He is not ill-appearing.  HENT:     Head: Normocephalic and atraumatic.      Right Ear: Ear canal and external ear normal. Tympanic membrane is not erythematous.     Left Ear: Hearing, tympanic membrane, ear canal and external ear normal.     Ears:     Comments: Some fluid behind right TM.  Some wax in right ear canal, not obstructing. Pulmonary:     Effort: Pulmonary effort is normal.  Skin:    General: Skin is warm and dry.  Neurological:     Mental Status: He is alert and oriented to person, place, and time. Mental status is at baseline.  Psychiatric:        Mood and Affect: Mood normal.        Behavior: Behavior normal.        Thought Content: Thought content normal.        Judgment: Judgment normal.       Results:   Studies obtained and personally reviewed by me:   Labs:       Component Value Date/Time   NA 139 11/26/2021 1039   K 4.4 11/26/2021 1039   CL 100 11/26/2021 1039   CO2 29 11/26/2021 1039   GLUCOSE 73 11/26/2021 1039   BUN 14 11/26/2021 1039   CREATININE 0.99 11/26/2021 1039  CALCIUM 9.5 11/26/2021 1039   PROT 6.6 11/26/2021 1039   ALBUMIN 4.7 08/29/2011 0945   AST 20 11/26/2021 1039   ALT 16 11/26/2021 1039   ALKPHOS 47 08/29/2011 0945   BILITOT 0.6 11/26/2021 1039   GFRNONAA 92 02/08/2021 1058   GFRAA 107 02/08/2021 1058     Lab Results  Component Value Date   WBC 5.1 11/26/2021   HGB 17.2 (H) 11/26/2021   HCT 50.1 (H) 11/26/2021   MCV 94.4 11/26/2021   PLT 133 (L) 11/26/2021    Lab Results  Component Value Date   CHOL 186 02/08/2021   HDL 48 02/08/2021   LDLCALC 113 (H) 02/08/2021   TRIG 142 02/08/2021   CHOLHDL 3.9 02/08/2021    No results found for: "HGBA1C"   Lab Results  Component Value Date   TSH 1.70 11/26/2021      Assessment & Plan:   Right serous otitis media: right ear pain, sound sensitivity for past two days. Administered Depo 80 mg IM.   Called pharmacies to check on supply of Maxalt.    I,Alexander Ruley,acting as a Education administrator for Elby Showers, MD.,have documented all relevant  documentation on the behalf of Elby Showers, MD,as directed by  Elby Showers, MD while in the presence of Elby Showers, MD.   I, Elby Showers, MD, have reviewed all documentation for this visit. The documentation on 11/18/22 for the exam, diagnosis, procedures, and orders are all accurate and complete.

## 2022-11-14 NOTE — Telephone Encounter (Signed)
scheduled

## 2022-11-18 NOTE — Patient Instructions (Signed)
Depomedrol 80 mg Im given in office.

## 2022-11-20 ENCOUNTER — Other Ambulatory Visit: Payer: Self-pay

## 2022-11-20 MED ORDER — RIZATRIPTAN BENZOATE 10 MG PO TABS: 10.0000 mg | ORAL_TABLET | ORAL | 3 refills | Status: DC | PRN

## 2022-11-24 ENCOUNTER — Other Ambulatory Visit: Payer: Self-pay | Admitting: Internal Medicine

## 2023-01-15 ENCOUNTER — Other Ambulatory Visit: Payer: Self-pay | Admitting: Internal Medicine

## 2023-01-19 ENCOUNTER — Telehealth: Payer: Self-pay | Admitting: Internal Medicine

## 2023-01-19 ENCOUNTER — Ambulatory Visit: Payer: BC Managed Care – PPO | Admitting: Internal Medicine

## 2023-01-19 ENCOUNTER — Encounter: Payer: Self-pay | Admitting: Internal Medicine

## 2023-01-19 VITALS — BP 106/70 | HR 78 | Temp 98.7°F | Ht 68.0 in | Wt 205.4 lb

## 2023-01-19 DIAGNOSIS — M79604 Pain in right leg: Secondary | ICD-10-CM | POA: Diagnosis not present

## 2023-01-19 DIAGNOSIS — Z8739 Personal history of other diseases of the musculoskeletal system and connective tissue: Secondary | ICD-10-CM | POA: Diagnosis not present

## 2023-01-19 DIAGNOSIS — M545 Low back pain, unspecified: Secondary | ICD-10-CM | POA: Diagnosis not present

## 2023-01-19 MED ORDER — MELOXICAM 15 MG PO TABS: 15.0000 mg | ORAL_TABLET | Freq: Every day | ORAL | 1 refills | Status: AC

## 2023-01-19 MED ORDER — HYDROCODONE-ACETAMINOPHEN 10-325 MG PO TABS: 1.0000 | ORAL_TABLET | Freq: Three times a day (TID) | ORAL | 0 refills | Status: AC | PRN

## 2023-01-19 MED ORDER — CYCLOBENZAPRINE HCL 10 MG PO TABS: 10.0000 mg | ORAL_TABLET | Freq: Three times a day (TID) | ORAL | 1 refills | Status: DC | PRN

## 2023-01-19 MED ORDER — METHYLPREDNISOLONE 4 MG PO TABS: ORAL_TABLET | ORAL | 1 refills | Status: DC

## 2023-01-19 NOTE — Patient Instructions (Addendum)
Have prescribed Medrol 4 mg dosepak to take as follows: 6 tablets by mouth on day 1 and decreasing by 1 tablet daily i.e. 6-5-4-3-2-1 taper.  Will take Norco 10/325 as follows: 1/2 to 1 tablet with food sparingly by mouth every 8 hours as needed for severe pain.  Flexeril (cyclobenzaprine) 10 mg 3 times daily as needed for muscle spasms.  Mobic 15 mg daily can be taken on a regular basis when back pain improves.  Encourage physical therapy.

## 2023-01-19 NOTE — Telephone Encounter (Signed)
Brad Holland [phone]  [name] called to say he was exercising over the weekend and hurt his lower back, so now he is in a lot of pain and is unable to do anything, would like to come in today. Hw stated it might be time to get a CT.

## 2023-01-19 NOTE — Progress Notes (Signed)
Patient Care Team: Margaree Mackintosh, MD as PCP - General (Internal Medicine) Maisie Fus, MD as PCP - Cardiology (Cardiology)  Visit Date: 01/19/23  Subjective:    Patient ID: Brad Holland , Male   DOB: [birth year], 47 y.o.    MRN: [id number]   47 y.o. Male presents today for lower back pain. He has a history of back pain but not recently. Had a flare-up two months ago and was better for a while before he had an episode of acute, severe pain when pulling himself up the stairs.  Has been taking ibuprofen without relief. Denies weakness or numbness in legs.  He will be leaving soon for [place].  He is a bit anxious about how he will do traveling with  back pain.   He has been to [facility] previously and did well with that.  He would like to have some medication for back pain.  He is also agreeable to going to physical therapy.  History of migraine headaches, anxiety, and mild elevation of LDL.  Had COVID-19 in June 2022  Remote history of bilateral tinnitus with normal hearing seen by Dr. Lazarus Salines in December 2020.  History of left shoulder pain 2014 treated conservatively and resolved.  History of tinea pedis 2014.  History of carbuncle left axilla treated with antibiotics in 2014.  History of left shoulder pain in 2014 with negative MRI of the C-spine at that time.  He also had an MRI of his shoulder by Dr. 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  He was treated conservatively and did well.  History of paronychia of right third finger.   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  He is married.  Non-smoker.  Social alcohol consumption.     Review of Systems  Constitutional:  Negative for fever and malaise/fatigue.  HENT:  Negative for congestion.   Eyes:  Negative for blurred vision.  Respiratory:  Negative for cough and shortness of breath.   Cardiovascular:  Negative for chest pain, palpitations and leg swelling.  Gastrointestinal:  Negative for vomiting.  Musculoskeletal:  Positive for back pain (Lower).  Skin:  Negative for rash.  Neurological:  Negative for loss of consciousness, weakness and headaches.       (-) Numbness legs   He is experiencing some numbness in right lower extremity     Objective:   Vitals: BP 106/70   Pulse 78   Temp 98.7 F (37.1 C) (Tympanic)   Ht 5\' 8"  (1.727 m)   Wt 205 lb 6.4 oz (93.2 kg)   SpO2 98%   BMI 31.23 kg/m    Physical Exam Vitals and nursing note reviewed.  Constitutional:      General: He is not in acute distress.    Appearance: Normal appearance. He is not ill-appearing.  HENT:     Head: Normocephalic and atraumatic.  Pulmonary:     Effort: Pulmonary effort is  normal.  Musculoskeletal:     Comments: Left straight leg raise test positive. Affects right lower back. Right straight leg raise test positive to lesser degree.  Skin:    General: Skin is warm and dry.  Neurological:     Mental Status: He is alert and oriented to person, place, and time. Mental status is at baseline.  Psychiatric:        Mood and Affect: Mood normal.        Behavior: Behavior normal.        Thought Content: Thought content normal.        Judgment: Judgment normal.       Results:   Studies obtained and personally reviewed by me:   Labs:       Component Value Date/Time   NA 139 11/26/2021 1039   K 4.4 11/26/2021 1039   CL 100 11/26/2021 1039   CO2 29 11/26/2021 1039   GLUCOSE 73 11/26/2021 1039   BUN 14 11/26/2021 1039   CREATININE 0.99 11/26/2021 1039   CALCIUM 9.5 11/26/2021 1039   PROT 6.6  11/26/2021 1039   ALBUMIN 4.7 08/29/2011 0945   AST 20 11/26/2021 1039   ALT 16 11/26/2021 1039   ALKPHOS 47 08/29/2011 0945   BILITOT 0.6 11/26/2021 1039   GFRNONAA 92 02/08/2021 1058   GFRAA 107 02/08/2021 1058     Lab Results  Component Value Date   WBC 5.1 11/26/2021   HGB 17.2 (H) 11/26/2021   HCT 50.1 (H) 11/26/2021   MCV 94.4 11/26/2021   PLT 133 (L) 11/26/2021    Lab Results  Component Value Date   CHOL 186 02/08/2021   HDL 48 02/08/2021   LDLCALC 113 (H) 02/08/2021   TRIG 142 02/08/2021   CHOLHDL 3.9 02/08/2021    No results found for: "HGBA1C"   Lab Results  Component Value Date   TSH 1.70 11/26/2021      Assessment & Plan:  Right-sided sciatica and low back pain  prescribed Medrol 4 mg 6-day dosepak to take in tapering course take as directed, cyclobenzaprine 10 mg three times daily as needed for muscle spasm, Norco 10-325 mg every 8 hours as needed, Mobic 15 mg daily with food if needed and not taking steroids.  I think PT will be helpful.  Generally, insurance companies require 3 weeks of physical therapy before an MRI will be approved.  I,Alexander Ruley,acting as a Neurosurgeon for Margaree Mackintosh, MD.,have documented all relevant documentation on the behalf of Margaree Mackintosh, MD,as directed by  Margaree Mackintosh, MD while in the presence of Margaree Mackintosh, MD.   I, Margaree Mackintosh, MD, have reviewed all documentation for this visit. The documentation on 01/19/23 for the exam, diagnosis, procedures, and orders are all accurate and complete.

## 2023-01-19 NOTE — Telephone Encounter (Signed)
scheduled

## 2023-02-10 ENCOUNTER — Telehealth: Payer: Self-pay | Admitting: Internal Medicine

## 2023-02-10 ENCOUNTER — Other Ambulatory Visit: Payer: Self-pay | Admitting: Internal Medicine

## 2023-02-10 NOTE — Telephone Encounter (Signed)
LVM to return call.

## 2023-02-10 NOTE — Telephone Encounter (Signed)
Brad Holland [phone]  [name] called about getting travel medication before he travels abroad next week. He would like to get Prednisone, Cipro and something for his back. He also ask about Paxlovid. I let him know he would need some type of office visit.

## 2023-02-11 ENCOUNTER — Encounter: Payer: Self-pay | Admitting: Internal Medicine

## 2023-02-11 ENCOUNTER — Ambulatory Visit: Payer: BC Managed Care – PPO | Admitting: Internal Medicine

## 2023-02-11 DIAGNOSIS — M545 Low back pain, unspecified: Secondary | ICD-10-CM | POA: Diagnosis not present

## 2023-02-11 DIAGNOSIS — M79604 Pain in right leg: Secondary | ICD-10-CM

## 2023-02-11 DIAGNOSIS — Z7184 Encounter for health counseling related to travel: Secondary | ICD-10-CM | POA: Diagnosis not present

## 2023-02-11 MED ORDER — METHYLPREDNISOLONE 4 MG PO TABS: ORAL_TABLET | ORAL | 0 refills | Status: DC

## 2023-02-11 MED ORDER — LEVOFLOXACIN 500 MG PO TABS: 500.0000 mg | ORAL_TABLET | Freq: Every day | ORAL | 0 refills | Status: DC

## 2023-02-11 MED ORDER — NIRMATRELVIR/RITONAVIR (PAXLOVID)TABLET: 3.0000 | ORAL_TABLET | Freq: Two times a day (BID) | ORAL | 0 refills | Status: AC

## 2023-02-11 NOTE — Patient Instructions (Signed)
Travel advice encounter and potential scenarios of low back pain, UTI, pneumonia, food poisoning, nausea discussed.  Refilled Medrol 4 mg 12-day Dosepak for severe back pain if needed.  Travel with Levaquin 500 milligrams daily for 10 days if needed for lower respiratory infection or urinary tract infection.  Has Maxalt and Zofran on hand if needed for migraine management or nausea.  Prescribed regular strength Paxlovid if patient develops COVID-19 while in [place].

## 2023-02-11 NOTE — Telephone Encounter (Signed)
scheduled

## 2023-02-11 NOTE — Progress Notes (Signed)
   Subjective:    Patient ID: Brad Holland, male    DOB: 11/26/1975, 47 y.o.   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  His back has improved somewhat but he is concerned with traveling and walking that it could get worse again. PT helped some.    Review of Systems No radiculopathy at present just some mild low back pain     Objective:   Physical Exam Not examined but discussion  with patient about potential medication needs during overseas travel.     Not examined but travel advice given with regard to Covid-19. Paxlovid regular strength Rx prescribed as we understand it may be hard to get in [place]. He has a normal GFR.  Also, have prescribed Levaquin 500 mg daily for 10 days if needed for lower respiratory infection, UTI or pneumonia. He has Maxalt on hand for migraines and Zofran if needed for nausea.  Refilled Medrol 4 mg 12 day taper if needed for severe back pain while traveling. He does not Rx today for narcotic pain meds.    Assessment & Plan:   Travel advice encounter  Hx of migraine headaches  Low Back pain with Radiculopathy- improving   Plan: Levaquin 500 milligrams daily for 10 days if needed for lower respiratory infection or urinary tract infection.  Refill Medrol 12-day Dosepak if needed for severe back pain.  Has Maxalt on hand for migraine headaches and Zofran if needed for nausea.  Regular strength Paxlovid prescribed if patient develops COVID-19 while in [place].  He has a normal GFR.

## 2023-03-19 ENCOUNTER — Telehealth: Payer: Self-pay | Admitting: Internal Medicine

## 2023-03-19 NOTE — Telephone Encounter (Signed)
Patient called and said he is in [place] and thinks he has a sinus infection. He said that he had a cold for about 2 weeks and then it went away and came back stronger. Congestion, headache, sore throat, ears stuffy, mucus getting darker yellow. He said he has 2 different antibiotics and he just wants to know which one he should take. The first is Levofloxacin 500mg  and has a full course. The second was Doxycycline Hyclate 100mg  tablets and also a full course. He also tested negative for covid. Please advise. Callback is [phone]

## 2023-06-29 ENCOUNTER — Ambulatory Visit: Payer: BC Managed Care – PPO | Admitting: Internal Medicine

## 2023-06-29 ENCOUNTER — Telehealth: Payer: Self-pay | Admitting: Internal Medicine

## 2023-06-29 ENCOUNTER — Encounter: Payer: Self-pay | Admitting: Internal Medicine

## 2023-06-29 VITALS — BP 130/80 | HR 103 | Ht 68.0 in | Wt 213.0 lb

## 2023-06-29 DIAGNOSIS — M5416 Radiculopathy, lumbar region: Secondary | ICD-10-CM | POA: Diagnosis not present

## 2023-06-29 DIAGNOSIS — Z8739 Personal history of other diseases of the musculoskeletal system and connective tissue: Secondary | ICD-10-CM | POA: Diagnosis not present

## 2023-06-29 MED ORDER — CYCLOBENZAPRINE HCL 10 MG PO TABS: 10.0000 mg | ORAL_TABLET | Freq: Three times a day (TID) | ORAL | 1 refills | Status: AC | PRN

## 2023-06-29 NOTE — Telephone Encounter (Signed)
Brad Holland [phone]  [name] called to say he threw his back out over the weekend and wants to come in to be seen. I scheduled him for 2:30.

## 2023-06-29 NOTE — Progress Notes (Addendum)
Patient Care Team: Margaree Mackintosh, MD as PCP - General (Internal Medicine) Maisie Fus, MD as PCP - Cardiology (Cardiology)  Visit Date: 06/29/23  Subjective:    Patient ID: Brad Holland , Male   DOB: 12-Dec-1975, 47 y.o.    MRN: [id number]   47 y.o. Male presents today for lower back pain since 06/15/23. Brad Holland has a longstanding hx of low back pain and radiculopathy. Has never had MRI of LS spine. Denies recent heavy lifting. Pain is mainly right-sided. Had diarrhea concurrent with date of back pain onset.This seems to be a coincidence to me. Seen in April and May of 2023 for similar symptoms. Brad Holland goes regularly to PT  at [facility]  Has been today  and says Brad Holland is getting  minimal relief. Brad Holland  has taken  Meloxicam and cyclobenzaprine.  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 No recent back injury or heavy lifting. Not feeling well today and is quite qorried. No numbness in feet. Maybe not as strong in right leg.  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 1-2 per week     Family History  Problem Relation Age of Onset   Bladder Cancer Father    Irritable bowel syndrome Mother     Social Hx: Married. 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 Former smoker- not recent. Social ETOH. 2 children.  Family WU:[id number] hx of bladder cancer.   Pt is a non-smoker.Hx of migraine headaches treated with Maxalt. Hx of generalized anxiety treated with Lexapro.   Review of Systems  Constitutional:  Negative for fever and malaise/fatigue.  HENT:  Negative for congestion.   Eyes:  Negative for blurred vision.  Respiratory:  Negative for cough and shortness of breath.   Cardiovascular:  Negative for chest  pain, palpitations and leg swelling.  Gastrointestinal:  Negative for vomiting.  Musculoskeletal:  Positive for back pain (Lower).  Skin:  Negative for rash.  Neurological:  Negative for loss of consciousness and headaches.        Objective:   Vitals: BP 130/80   Pulse (!) 103   Ht 5\' 8"  (1.727 m)   Wt 213 lb (96.6 kg)   SpO2 96%   BMI 32.39 kg/m    Physical Exam Vitals and nursing note reviewed.  Constitutional:      General: Brad Holland is not in acute distress.    Appearance: Normal appearance. Brad Holland is not ill-appearing.  HENT:     Head: Normocephalic and atraumatic.  Pulmonary:     Effort: Pulmonary effort is normal.  Musculoskeletal:     Comments: Right straight leg raise test positive. Left straight leg raise test positive with more significant pain. 5/5 strength in all groups tested.  Skin:    General: Skin is warm and dry.  Neurological:     Mental Status: Brad Holland is alert and oriented to person, place, and time. Mental status is at baseline.     Deep Tendon Reflexes:     Reflex Scores:      Patellar reflexes are 2+ on the right side and 2+ on the left side.  Achilles reflexes are 1+ on the right side and 1+ on the left side. Psychiatric:        Mood and Affect: Mood normal.        Behavior: Behavior normal.        Thought Content: Thought content normal.        Judgment: Judgment normal.       Results:   Studies obtained and personally reviewed by me:   Labs:       Component Value Date/Time   NA 139 11/26/2021 1039   K 4.4 11/26/2021 1039   CL 100 11/26/2021 1039   CO2 29 11/26/2021 1039   GLUCOSE 73 11/26/2021 1039   BUN 14 11/26/2021 1039   CREATININE 0.99 11/26/2021 1039   CALCIUM 9.5 11/26/2021 1039   PROT 6.6 11/26/2021 1039   ALBUMIN 4.7 08/29/2011 0945   AST 20 11/26/2021 1039   ALT 16 11/26/2021 1039   ALKPHOS 47 08/29/2011 0945   BILITOT 0.6 11/26/2021 1039   GFRNONAA 92 02/08/2021 1058   GFRAA 107 02/08/2021 1058     Lab Results   Component Value Date   WBC 5.1 11/26/2021   HGB 17.2 (H) 11/26/2021   HCT 50.1 (H) 11/26/2021   MCV 94.4 11/26/2021   PLT 133 (L) 11/26/2021    Lab Results  Component Value Date   CHOL 186 02/08/2021   HDL 48 02/08/2021   LDLCALC 113 (H) 02/08/2021   TRIG 142 02/08/2021   CHOLHDL 3.9 02/08/2021    No results found for: "HGBA1C"   Lab Results  Component Value Date   TSH 1.70 11/26/2021      Assessment & Plan:   Low back pain with  radiculopathy: Brad Holland has been going regularly to [facility]. Have requested notes. Brad Holland was there today. Not improving with latest episode of back pain. Conservative treatment is not working and Brad Holland is concerned. I think Brad Holland needs MRI of LS spine to clarify his situation. Will order MRI  of LS spine.  Obtain PSA.  Take Medrol 4 mg tablets in tapering course 6-5-4-3-2-1 and Flexeril 10 mg up to 3 times daily as needed.  I,Alexander Ruley,acting as a Neurosurgeon for Margaree Mackintosh, MD.,have documented all relevant documentation on the behalf of Margaree Mackintosh, MD,as directed by  Margaree Mackintosh, MD while in the presence of Margaree Mackintosh, MD.   I, Margaree Mackintosh, MD, have reviewed all documentation for this visit. The documentation on 06/29/23 for the exam, diagnosis, procedures, and orders are all accurate and complete.  Addendum have received notes from [facility] that Brad Holland has been seen there for back pain on the following dates: 12-15-22 01-20-23 01-21-23 01-23-23 01-28-23 02-05-23 02-12-23 06-18-23 06-29-23  Valinda Hoar has denied authorization of his MRI.Calling for peer to peer review  on 07/03/23.Spoke with reviewer. Adding additional information to note regarding dates of PT treatments as requested. They are to reach out to [place] on Monday.   Epidural steroids are definitely being considered as an option as well as lumbar surgery if needed depending on MRI results. Brad Holland may need neurosurgical consultation depending on results of lumbar MRI. Brad Holland is healthy and could undergo  these procedures without difficulty. MJB, MD

## 2023-06-29 NOTE — Patient Instructions (Signed)
Will order MRI of LS spine to assess recurrent and recently persistent lumbar back pain not responding to PT and conservative treatment including meds.

## 2023-07-09 ENCOUNTER — Ambulatory Visit
Admission: RE | Admit: 2023-07-09 | Discharge: 2023-07-09 | Disposition: A | Discharge status: Home / Self Care | Payer: BC Managed Care – PPO | Source: Ambulatory Visit | Attending: Internal Medicine | Admitting: Internal Medicine

## 2023-07-09 DIAGNOSIS — M5416 Radiculopathy, lumbar region: Secondary | ICD-10-CM

## 2023-07-09 NOTE — Progress Notes (Signed)
MRI faxed

## 2023-09-17 ENCOUNTER — Other Ambulatory Visit: Payer: Self-pay | Admitting: Internal Medicine

## 2023-09-18 ENCOUNTER — Encounter: Payer: Self-pay | Admitting: Internal Medicine

## 2023-09-18 ENCOUNTER — Ambulatory Visit: Payer: BC Managed Care – PPO | Admitting: Internal Medicine

## 2023-09-18 VITALS — BP 100/70 | HR 64 | Temp 97.8°F | Ht 68.0 in | Wt 213.0 lb

## 2023-09-18 DIAGNOSIS — J0100 Acute maxillary sinusitis, unspecified: Secondary | ICD-10-CM | POA: Diagnosis not present

## 2023-09-18 MED ORDER — AZITHROMYCIN 250 MG PO TABS: ORAL_TABLET | ORAL | 0 refills | Status: AC

## 2023-09-18 MED ORDER — METHYLPREDNISOLONE ACETATE 80 MG/ML IJ SUSP
80.0000 mg | Freq: Once | INTRAMUSCULAR | Status: AC
  Administered 2023-09-18: 80 mg via INTRAMUSCULAR

## 2023-09-18 NOTE — Progress Notes (Signed)
Patient Care Team: Margaree Mackintosh, MD as PCP - General (Internal Medicine) Maisie Fus, MD as PCP - Cardiology (Cardiology)  Visit Date: 09/18/23  Subjective:    Patient ID: Brad Holland , Male   DOB: [birth year], 47 y.o.    MRN: [id number]   47 y.o. Male presents today for persistent respiratory symptoms. Reports Brad Holland was sick with a respiratory illness several weeks ago and has been having persistent chest congestion, discolored sputum production. Denies fever, chills, headache, post-nasal drip.  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     Review of Systems  Constitutional:  Negative for chills, fever and malaise/fatigue.  HENT:  Positive for congestion. Negative for sore throat.   Eyes:  Negative for blurred vision.  Respiratory:  Positive for sputum production. Negative for cough and shortness of breath.   Cardiovascular:  Negative for chest pain, palpitations and leg swelling.  Gastrointestinal:  Negative for vomiting.  Musculoskeletal:  Negative for back pain.  Skin:  Negative for rash.  Neurological:  Negative for loss of consciousness and headaches.        Objective:   Vitals: BP 100/70   Pulse 64   Temp 97.8 F (36.6 C)   Ht 5\' 8"  (1.727 m)   Wt 213 lb (96.6 kg)   SpO2 97%   BMI 32.39 kg/m    Physical Exam Vitals and nursing note reviewed.  Constitutional:      General: Brad Holland is not in acute distress.    Appearance: Normal appearance. Brad Holland is not ill-appearing.  HENT:     Head: Normocephalic and atraumatic.     Right Ear: Hearing, tympanic membrane, ear canal and external ear normal.     Left Ear: Hearing, tympanic membrane, ear  canal and external ear normal.     Mouth/Throat:     Comments: Pharynx slightly injected. Pulmonary:     Effort: Pulmonary effort is normal. No respiratory distress.     Breath sounds: Normal breath sounds. No wheezing or rales.  Skin:    General: Skin is warm and dry.  Neurological:     Mental Status: Brad Holland is alert and oriented to person, place, and time. Mental status is at baseline.  Psychiatric:        Mood and Affect: Mood normal.        Behavior: Behavior normal.        Thought Content: Thought content normal.        Judgment: Judgment normal.       Results:   Studies obtained and personally reviewed by me:   Labs:       Component Value Date/Time   NA 139 11/26/2021 1039   K 4.4 11/26/2021 1039   CL 100 11/26/2021 1039   CO2 29 11/26/2021 1039   GLUCOSE 73 11/26/2021 1039   BUN 14 11/26/2021 1039   CREATININE 0.99 11/26/2021 1039   CALCIUM 9.5 11/26/2021 1039   PROT 6.6 11/26/2021 1039   ALBUMIN 4.7 08/29/2011 0945   AST 20 11/26/2021 1039   ALT 16 11/26/2021 1039  ALKPHOS 47 08/29/2011 0945   BILITOT 0.6 11/26/2021 1039   GFRNONAA 92 02/08/2021 1058   GFRAA 107 02/08/2021 1058     Lab Results  Component Value Date   WBC 5.1 11/26/2021   HGB 17.2 (H) 11/26/2021   HCT 50.1 (H) 11/26/2021   MCV 94.4 11/26/2021   PLT 133 (L) 11/26/2021    Lab Results  Component Value Date   CHOL 186 02/08/2021   HDL 48 02/08/2021   LDLCALC 113 (H) 02/08/2021   TRIG 142 02/08/2021   CHOLHDL 3.9 02/08/2021    No results found for: "HGBA1C"   Lab Results  Component Value Date   TSH 1.70 11/26/2021      Assessment & Plan:   Persistent sinusitis: prescribed Z-Pak two tabs day 1 followed by one tab days 2-5. Administered Depo-Medrol 80 mg IM. Contact us if symptoms worsen or fail to improve.  Vaccine counseling: discussed. Brad Holland will get vaccine updates after recovering.    I,Brad Holland,acting as a Neurosurgeon for Margaree Mackintosh, MD.,have documented all relevant  documentation on the behalf of Margaree Mackintosh, MD,as directed by  Margaree Mackintosh, MD while in the presence of Margaree Mackintosh, MD.   I, Margaree Mackintosh, MD, have reviewed all documentation for this visit. The documentation on 09/22/23 for the exam, diagnosis, procedures, and orders are all accurate and complete.

## 2023-09-21 ENCOUNTER — Ambulatory Visit: Payer: BC Managed Care – PPO | Admitting: Internal Medicine

## 2023-09-22 ENCOUNTER — Encounter: Payer: Self-pay | Admitting: Internal Medicine

## 2023-09-22 NOTE — Patient Instructions (Signed)
You have been diagnosed with persistent sinusitis.  Zithromax Z-PAK prescribed to take 2 tabs day 1 followed by 1 tab days 2 through 5.  Depo-Medrol 80 mg IM given.  Contact us if symptoms worsen or fail to improve.  Vaccines discussed but you need to be fully recovered from this before considering vaccines.  It was a pleasure to see you today as always.

## 2023-09-30 ENCOUNTER — Other Ambulatory Visit: Payer: Self-pay | Admitting: Internal Medicine

## 2023-10-29 NOTE — Telephone Encounter (Unsigned)
 Copied from CRM [id number]. Topic: Clinical - Request for Lab/Test Order >> Oct 29, 2023  9:21 AM Brad Holland wrote: Reason for CRM: Following up on your instructions to get a colonoscopy. Can you recommend someone or do I need  to find someone myself? Callback number is [phone]

## 2023-11-05 ENCOUNTER — Telehealth: Payer: Self-pay | Admitting: Internal Medicine

## 2023-11-05 NOTE — Telephone Encounter (Signed)
Copied from CRM [id number]. Topic: Clinical - Request for Lab/Test Order >> Nov 05, 2023  1:27 PM Dennison Nancy wrote: Reason for CRM: patient request to schedule for a routine colonoscopy

## 2023-11-05 NOTE — Telephone Encounter (Signed)
Called patient back and let him know he would just need to call [facility], because he is already established with them, and to send me a MyChart message if he does need a referral.

## 2023-11-13 ENCOUNTER — Encounter: Payer: Self-pay | Admitting: Gastroenterology

## 2023-11-25 ENCOUNTER — Ambulatory Visit: Payer: Self-pay | Admitting: *Deleted

## 2023-11-25 VITALS — Ht 68.0 in | Wt 205.0 lb

## 2023-11-25 DIAGNOSIS — Z1211 Encounter for screening for malignant neoplasm of colon: Secondary | ICD-10-CM

## 2023-11-25 MED ORDER — SUFLAVE 178.7 G PO SOLR: 1.0000 | Freq: Once | ORAL | 0 refills | Status: AC

## 2023-11-25 NOTE — Progress Notes (Signed)
 Pt's name and DOB verified at the beginning of the pre-visit wit 2 identifiers  Pt denies any difficulty with ambulating,sitting, laying down or rolling side to side  Pt has no issues with ambulation   Pt has no issues moving head neck or swallowing  No egg or soy allergy known to patient   No issues known to pt with past sedation with any surgeries or procedures  Patient denies ever being intubated  No FH of Malignant Hyperthermia  Pt is not on diet pills or shots  Pt is not on home 02   Pt is not on blood thinners   Pt denies issues with constipation   Pt is not on dialysis  Pt denise any abnormal heart rhythms   Pt denies any upcoming cardiac testing  Patient's chart reviewed by Cathlyn Parsons CNRA prior to pre-visit and patient appropriate for the [facility].  Pre-visit completed and red dot placed by patient's name on their procedure day (on provider's schedule).    Visit by phone  Pt states weight is 205 lb   IInstructions reviewed. Pt given [facility], [facility] main # and MD on call # prior to instructions.  Pt states understanding of instructions. Instructed to review again prior to procedure. Pt states they will.   Informed pt that they will receive a text or  call from [facility] regarding there prep med.

## 2023-12-02 ENCOUNTER — Encounter: Payer: Self-pay | Admitting: Gastroenterology

## 2023-12-08 ENCOUNTER — Encounter: Payer: Self-pay | Admitting: Gastroenterology

## 2023-12-08 ENCOUNTER — Ambulatory Visit: Payer: Self-pay | Admitting: Gastroenterology

## 2023-12-08 VITALS — BP 115/80 | HR 73 | Temp 98.2°F | Resp 12 | Ht 68.0 in | Wt 205.0 lb

## 2023-12-08 DIAGNOSIS — D124 Benign neoplasm of descending colon: Secondary | ICD-10-CM

## 2023-12-08 DIAGNOSIS — D125 Benign neoplasm of sigmoid colon: Secondary | ICD-10-CM

## 2023-12-08 DIAGNOSIS — K635 Polyp of colon: Secondary | ICD-10-CM

## 2023-12-08 DIAGNOSIS — Z1211 Encounter for screening for malignant neoplasm of colon: Secondary | ICD-10-CM | POA: Diagnosis present

## 2023-12-08 DIAGNOSIS — K640 First degree hemorrhoids: Secondary | ICD-10-CM | POA: Diagnosis not present

## 2023-12-08 DIAGNOSIS — K5730 Diverticulosis of large intestine without perforation or abscess without bleeding: Secondary | ICD-10-CM | POA: Diagnosis not present

## 2023-12-08 MED ORDER — SODIUM CHLORIDE 0.9 % IV SOLN: 500.0000 mL | Freq: Once | INTRAVENOUS | Status: DC

## 2023-12-08 NOTE — Progress Notes (Signed)
 Called to room to assist during endoscopic procedure.  Patient ID and intended procedure confirmed with present staff. Received instructions for my participation in the procedure from the performing physician.

## 2023-12-08 NOTE — Patient Instructions (Signed)

## 2023-12-08 NOTE — Progress Notes (Signed)
 Report to PACU, RN, vss, BBS= Clear.

## 2023-12-08 NOTE — Op Note (Signed)
  [facility] Patient Name: Brad Holland Procedure Date: 12/08/2023 4:00 PM MRN: [id number] Endoscopist: Doristine Locks , MD, [id number] Age: 48 Referring MD:  Date of Birth: [birth year] Gender: Male Account #: [id number] Procedure:                Colonoscopy Indications:              Screening for colorectal malignant neoplasm, This                            is the patient's first colonoscopy Medicines:                Monitored Anesthesia Care Procedure:                Pre-Anesthesia Assessment:                           - Prior to the procedure, a History and Physical                            was performed, and patient medications and                            allergies were reviewed. The patient's tolerance of                            previous anesthesia was also reviewed. The risks                            and benefits of the procedure and the sedation                            options and risks were discussed with the patient.                            All questions were answered, and informed consent                            was obtained. Prior Anticoagulants: The patient has                            taken no anticoagulant or antiplatelet agents. ASA                            Grade Assessment: II - A patient with mild systemic                            disease. After reviewing the risks and benefits,                            the patient was deemed in satisfactory condition to                            undergo the procedure.  After obtaining informed consent, the colonoscope                            was passed under direct vision. Throughout the                            procedure, the patient's blood pressure, pulse, and                            oxygen saturations were monitored continuously. The                            Olympus Scope [id number] was introduced through the                            anus and advanced to  the the terminal ileum. The                            colonoscopy was performed without difficulty. The                            patient tolerated the procedure well. The quality                            of the bowel preparation was good. The terminal                            ileum, ileocecal valve, appendiceal orifice, and                            rectum were photographed. Scope In: 4:09:46 PM Scope Out: 4:27:46 PM Scope Withdrawal Time: 0 hours 14 minutes 45 seconds  Total Procedure Duration: 0 hours 18 minutes 0 seconds  Findings:                 The perianal and digital rectal examinations were                            normal.                           Three sessile polyps were found in the sigmoid                            colon (1) and descending colon (2). The polyps were                            3 to 4 mm in size. These polyps were removed with a                            cold snare. Resection and retrieval were complete.                            Estimated blood loss was minimal.  A few small-mouthed diverticula were found in the                            sigmoid colon.                           Non-bleeding internal hemorrhoids were found during                            retroflexion. The hemorrhoids were small and Grade                            I (internal hemorrhoids that do not prolapse).                           The terminal ileum appeared normal. Complications:            No immediate complications. Estimated Blood Loss:     Estimated blood loss was minimal. Impression:               - Three 3 to 4 mm polyps in the sigmoid colon and                            in the descending colon, removed with a cold snare.                            Resected and retrieved.                           - Diverticulosis in the sigmoid colon.                           - Non-bleeding internal hemorrhoids.                           - The examined  portion of the ileum was normal. Recommendation:           - Patient has a contact number available for                            emergencies. The signs and symptoms of potential                            delayed complications were discussed with the                            patient. Return to normal activities tomorrow.                            Written discharge instructions were provided to the                            patient.                           - Resume previous diet.                           -  Continue present medications.                           - Await pathology results.                           - Repeat colonoscopy for surveillance based on                            pathology results.                           - Return to GI office PRN. Doristine Locks, MD 12/08/2023 4:31:34 PM

## 2023-12-08 NOTE — Progress Notes (Signed)
 GASTROENTEROLOGY PROCEDURE H&P NOTE   Primary Care Physician: Margaree Mackintosh, MD    Reason for Procedure:  Colon Cancer screening  Plan:    Colonoscopy  Patient is appropriate for endoscopic procedure(s) in the ambulatory ([facility]) setting.  The nature of the procedure, as well as the risks, benefits, and alternatives were carefully and thoroughly reviewed with the patient. Ample time for discussion and questions allowed. The patient understood, was satisfied, and agreed to proceed.     HPI: Brad Holland is a 48 y.o. male who presents for colonoscopy for routine Colon Cancer screening.  No active GI symptoms.  No known family history of colon cancer or related malignancy.  Patient is otherwise without complaints or active issues today.  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 1-2 per week    Past Surgical History:  Procedure Laterality Date   BRAVO PH STUDY N/A 08/07/2014   Procedure: BRAVO PH STUDY;  Surgeon: Louis Meckel, MD;  Location: [facility];  Service: Endoscopy;  Laterality: N/A;   ESOPHAGOGASTRODUODENOSCOPY (EGD) WITH PROPOFOL N/A 08/07/2014   Procedure: ESOPHAGOGASTRODUODENOSCOPY (EGD) WITH PROPOFOL;  Surgeon: Louis Meckel, MD;  Location: [facility];  Service: Endoscopy;  Laterality: N/A;   TONSILLECTOMY     UPPER GASTROINTESTINAL ENDOSCOPY      Prior to Admission medications   Medication Sig Start Date End Date Taking? Authorizing Provider  clonazePAM (KLONOPIN) 0.5 MG tablet  11/26/16  Yes [provider]  escitalopram (LEXAPRO) 20 MG tablet Take 20 mg by mouth daily.   Yes [provider]  Lactobacillus Rhamnosus, GG, (CULTURELLE) CAPS Take by mouth.   Yes [provider]  Multiple Vitamin (MULTI-VITAMINS) TABS Take by mouth.   Yes [provider]  Riboflavin (VITAMIN B-2) 25 MG TABS Take by mouth.   Yes [provider]  b complex vitamins tablet Take by mouth. Patient not taking: Reported on 11/25/2023    [provider]  clotrimazole-betamethasone (LOTRISONE) cream APPLY TO AFFECTED AREA TWICE A DAY. 02/10/23   Margaree Mackintosh, MD  cyclobenzaprine (FLEXERIL) 10 MG tablet Take 1 tablet (10 mg total) by mouth 3 (three) times daily as needed for muscle spasms. 06/29/23   Margaree Mackintosh, MD  ibuprofen (ADVIL) 600 MG tablet TAKE ONE TABLET BY MOUTH EVERY 8 HOURS AS NEEDED 09/18/23   Margaree Mackintosh, MD  meloxicam (MOBIC) 15 MG tablet Take 1 tablet (15 mg total) by mouth daily. 01/19/23   Margaree Mackintosh, MD  ondansetron (ZOFRAN) 4 MG tablet TAKE 1 TABLET BY MOUTH EVERY 8 HOURS AS NEEDED FOR NAUSEA AND VOMITING 03/20/22   Margaree Mackintosh, MD  rizatriptan (MAXALT) 10 MG tablet Take 1 tablet (10 mg total) by mouth as needed for migraine. May repeat in 2 hours if needed 10/01/23   Margaree Mackintosh, MD    Current Outpatient Medications  Medication Sig Dispense Refill   clonazePAM (KLONOPIN) 0.5 MG tablet      escitalopram (LEXAPRO) 20 MG tablet Take 20 mg by mouth daily.     Lactobacillus Rhamnosus, GG, (CULTURELLE) CAPS Take by mouth.     Multiple Vitamin (MULTI-VITAMINS) TABS Take by mouth.     Riboflavin (VITAMIN B-2) 25 MG TABS Take by mouth.  b complex vitamins tablet Take by mouth. (Patient not taking: Reported on 11/25/2023)     clotrimazole-betamethasone (LOTRISONE) cream APPLY TO AFFECTED AREA TWICE A DAY. 30 g PRN   cyclobenzaprine (FLEXERIL) 10 MG tablet Take 1 tablet (10 mg total) by mouth 3 (three) times daily as needed for muscle spasms. 30 tablet 1   ibuprofen (ADVIL) 600 MG tablet TAKE ONE TABLET BY MOUTH EVERY 8 HOURS AS NEEDED 90 tablet 3   meloxicam (MOBIC) 15 MG tablet Take 1 tablet (15 mg total) by mouth daily. 30 tablet 1   ondansetron (ZOFRAN) 4 MG tablet TAKE 1 TABLET BY MOUTH EVERY 8 HOURS AS NEEDED FOR NAUSEA  AND VOMITING 18 tablet 1   rizatriptan (MAXALT) 10 MG tablet Take 1 tablet (10 mg total) by mouth as needed for migraine. May repeat in 2 hours if needed 5 tablet 3   Current Facility-Administered Medications  Medication Dose Route Frequency Provider Last Rate Last Admin   0.9 %  sodium chloride infusion  500 mL Intravenous Once Vince Ainsley V, DO        Allergies as of 12/08/2023 - Review Complete 12/08/2023  Allergen Reaction Noted   Cortisone Other (See Comments) 01/08/2017   Egg-derived products Other (See Comments) 07/24/2014    Family History  Problem Relation Age of Onset   Colon polyps Mother    Irritable bowel syndrome Mother    Bladder Cancer Father    Colon cancer Neg Hx    Esophageal cancer Neg Hx    Rectal cancer Neg Hx    Stomach cancer Neg Hx     Social History   Socioeconomic History   Marital status: Married    Spouse name: Not on file   Number of children: 2   Years of education: Not on file   Highest education level: Not on file  Occupational History   Occupation: professor    Employer: [employer]  Tobacco Use   Smoking status: Former    Current packs/day: 0.00    Types: Cigarettes    Quit date: 06/07/2009    Years since quitting: 14.5   Smokeless tobacco: Never  Vaping Use   Vaping status: Never Used  Substance and Sexual Activity   Alcohol use: No   Drug use: No   Sexual activity: Yes  Other Topics Concern   Not on file  Social History Narrative   Not on file   Social Drivers of Health   Financial Resource Strain: Not on file  Food Insecurity: Not on file  Transportation Needs: Not on file  Physical Activity: Not on file  Stress: Not on file  Social Connections: Not on file  Intimate Partner Violence: Not on file    Physical Exam: Vital signs in last 24 hours: @BP  128/78   Pulse 82   Temp 98.2 F (36.8 C) (Temporal)   Ht 5\' 8"  (1.727 m)   Wt 205 lb (93 kg)   SpO2 97%   BMI 31.17 kg/m  GEN: NAD EYE: Sclerae  anicteric ENT: MMM CV: Non-tachycardic Pulm: CTA b/l GI: Soft, NT/ND NEURO:  Alert & Oriented x 3   Doristine Locks, DO [facility]   12/08/2023 4:02 PM

## 2023-12-08 NOTE — Progress Notes (Signed)
 Pt's states no medical or surgical changes since previsit or office visit.

## 2023-12-09 ENCOUNTER — Telehealth: Payer: Self-pay | Admitting: *Deleted

## 2023-12-09 NOTE — Telephone Encounter (Signed)
  Follow up Call-     12/08/2023    3:24 PM  Call back number  Post procedure Call Back phone  # [phone]  Permission to leave phone message Yes    Post procedure follow up phone call. No answer at number given.  Left message on voicemail.

## 2023-12-11 LAB — SURGICAL PATHOLOGY

## 2023-12-17 ENCOUNTER — Encounter: Payer: Self-pay | Admitting: Gastroenterology

## 2023-12-27 ENCOUNTER — Other Ambulatory Visit: Payer: Self-pay | Admitting: Internal Medicine

## 2024-03-14 NOTE — Progress Notes (Signed)
 Patient Care Team: Perri Ronal PARAS, MD as PCP - General (Internal Medicine) Alvan Ronal BRAVO, MD (Inactive) as PCP - Cardiology (Cardiology)  Visit Date: 03/15/24  Subjective:   Chief Complaint  Patient presents with   Ear Pain    Right ear a little in left. Started 4 days ago. Going on sea and doesn't want ear infection.    Patient PI:Brad Holland, Brad Holland DOB:09-08-1976,48 y.o. FMW:[id number]   48 y.o.Male presents today for acute sick visit with Right Otalgia. Says that he has upcoming travel, so when his right ear began hurting a couple of days ago he wanted to be evaluated.   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 1-2 per week    Allergies  Allergen Reactions   Cortisone Other (See Comments)    Cortisone injections cause headaches   Egg-Derived Products Other (See Comments)    headaches    Family History  Problem Relation Age of Onset   Colon polyps Mother    Irritable bowel syndrome Mother    Bladder Cancer Father    Colon cancer Neg Hx    Esophageal cancer Neg Hx    Rectal cancer Neg Hx    Stomach cancer Neg Hx    Social History   Social History Narrative   Not on file   Review of Systems  HENT:  Positive for ear pain (right). Negative for sore throat.      Objective:  Vitals: BP 116/82   Pulse 80   Temp 98.5 F (36.9 C) (Temporal)   Ht 5' 8 (1.727 m)   Wt 208 lb 1.9 oz (94.4 kg)   SpO2 96%   BMI 31.64 kg/m   Physical Exam Vitals and nursing note reviewed.  Constitutional:      General: He is not in acute distress.    Appearance: Normal appearance. He is not ill-appearing.  HENT:     Head: Normocephalic and atraumatic.     Right Ear: Ear canal and external ear normal. Tympanic membrane is injected (slightly, centrally). Tympanic membrane is not erythematous.     Left Ear: Ear  canal and external ear normal. Tympanic membrane is injected (L<R). Tympanic membrane is not erythematous.     Mouth/Throat:     Mouth: Mucous membranes are moist.     Pharynx: Oropharynx is clear. No oropharyngeal exudate or posterior oropharyngeal erythema.  Pulmonary:     Effort: Pulmonary effort is normal.     Breath sounds: Normal breath sounds. No wheezing, rhonchi or rales.  Lymphadenopathy:     Cervical: No cervical adenopathy.   Skin:    General: Skin is warm and dry.   Neurological:     Mental Status: He is alert and oriented to person, place, and time. Mental status is at baseline.   Psychiatric:        Mood and Affect: Mood normal.        Behavior: Behavior normal.        Thought Content: Thought content normal.        Judgment: Judgment normal.     Results:  Studies Obtained And Personally Reviewed By Me: Labs:     Component Value Date/Time   NA 139 11/26/2021 1039   K 4.4 11/26/2021 1039   CL 100  11/26/2021 1039   CO2 29 11/26/2021 1039   GLUCOSE 73 11/26/2021 1039   BUN 14 11/26/2021 1039   CREATININE 0.99 11/26/2021 1039   CALCIUM 9.5 11/26/2021 1039   PROT 6.6 11/26/2021 1039   ALBUMIN 4.7 08/29/2011 0945   AST 20 11/26/2021 1039   ALT 16 11/26/2021 1039   ALKPHOS 47 08/29/2011 0945   BILITOT 0.6 11/26/2021 1039   GFRNONAA 92 02/08/2021 1058   GFRAA 107 02/08/2021 1058    Lab Results  Component Value Date   WBC 5.1 11/26/2021   HGB 17.2 (H) 11/26/2021   HCT 50.1 (H) 11/26/2021   MCV 94.4 11/26/2021   PLT 133 (L) 11/26/2021   Lab Results  Component Value Date   CHOL 186 02/08/2021   HDL 48 02/08/2021   LDLCALC 113 (H) 02/08/2021   TRIG 142 02/08/2021   CHOLHDL 3.9 02/08/2021   Lab Results  Component Value Date   TSH 1.70 11/26/2021    Assessment & Plan:   Meds ordered this encounter  Medications   azithromycin  (ZITHROMAX ) 250 MG tablet    Sig: Take 2 tablets on day 1, then 1 tablet daily on days 2 through 5    Dispense:  6 tablet     Refill:  0   HYDROcodone -acetaminophen  (NORCO) 10-325 MG tablet    Sig: One tab with food every 8 hours as needed for pain    Dispense:  15 tablet    Refill:  0   Otitis Media, Bilateral R>L: he has upcoming travel, so when his right ear began hurting a couple of days ago he wanted to be evaluated. On exam, right ear injected more so than left, no erythema. Sending in 250 mg Azithromycin  - take 2 tablets on Day 1 and 1 tablet on Days 2-5 and Norco 10-325 mg to take as needed every 8 hours.     I,Brad Holland,acting as a Neurosurgeon for Ronal JINNY Hailstone, MD.,have documented all relevant documentation on the behalf of Ronal JINNY Hailstone, MD,as directed by  Ronal JINNY Hailstone, MD while in the presence of Ronal JINNY Hailstone, MD.   I, Ronal JINNY Hailstone, MD, have reviewed all documentation for this visit. The documentation on 03/17/24 for the exam, diagnosis, procedures, and orders are all accurate and complete.

## 2024-03-15 ENCOUNTER — Ambulatory Visit: Payer: Self-pay | Admitting: Internal Medicine

## 2024-03-15 VITALS — BP 116/82 | HR 80 | Temp 98.5°F | Ht 68.0 in | Wt 208.1 lb

## 2024-03-15 DIAGNOSIS — H6503 Acute serous otitis media, bilateral: Secondary | ICD-10-CM | POA: Diagnosis not present

## 2024-03-15 MED ORDER — HYDROCODONE-ACETAMINOPHEN 10-325 MG PO TABS: ORAL_TABLET | ORAL | 0 refills | Status: AC

## 2024-03-15 MED ORDER — AZITHROMYCIN 250 MG PO TABS: ORAL_TABLET | ORAL | 0 refills | Status: DC

## 2024-03-17 ENCOUNTER — Encounter: Payer: Self-pay | Admitting: Internal Medicine

## 2024-03-17 NOTE — Patient Instructions (Addendum)
 May take Norco 10/325 sparingly if needed for pain while in [place] or on plane. Take Zithromax  for otitis media 2 tabs day 1 followed by one tab days 2-5. Safe travels this summer.

## 2024-03-20 ENCOUNTER — Encounter: Payer: Self-pay | Admitting: Internal Medicine

## 2024-03-20 ENCOUNTER — Other Ambulatory Visit: Payer: Self-pay | Admitting: Internal Medicine

## 2024-03-21 ENCOUNTER — Other Ambulatory Visit: Payer: Self-pay

## 2024-03-21 ENCOUNTER — Telehealth: Payer: Self-pay | Admitting: Internal Medicine

## 2024-03-21 DIAGNOSIS — G43009 Migraine without aura, not intractable, without status migrainosus: Secondary | ICD-10-CM

## 2024-03-21 DIAGNOSIS — Z7184 Encounter for health counseling related to travel: Secondary | ICD-10-CM

## 2024-03-21 DIAGNOSIS — J0100 Acute maxillary sinusitis, unspecified: Secondary | ICD-10-CM

## 2024-03-21 MED ORDER — AZITHROMYCIN 250 MG PO TABS: ORAL_TABLET | ORAL | 1 refills | Status: AC

## 2024-03-21 MED ORDER — AZITHROMYCIN 250 MG PO TABS: ORAL_TABLET | ORAL | 1 refills | Status: DC

## 2024-03-21 MED ORDER — IBUPROFEN 600 MG PO TABS: 600.0000 mg | ORAL_TABLET | Freq: Three times a day (TID) | ORAL | 3 refills | Status: DC | PRN

## 2024-03-21 MED ORDER — IBUPROFEN 600 MG PO TABS: 600.0000 mg | ORAL_TABLET | Freq: Three times a day (TID) | ORAL | 3 refills | Status: AC | PRN

## 2024-03-21 NOTE — Telephone Encounter (Signed)
 Sending in Z pak  and refilling Lexapro  as requested. MJB, MD

## 2024-03-21 NOTE — Telephone Encounter (Signed)
 Have refilled  Ibuprofen  and sent in Z-pak to [facility] today. MJB, MD

## 2024-07-11 ENCOUNTER — Ambulatory Visit: Payer: Self-pay | Admitting: *Deleted

## 2024-07-11 ENCOUNTER — Encounter: Payer: Self-pay | Admitting: Internal Medicine

## 2024-07-11 ENCOUNTER — Other Ambulatory Visit: Payer: Self-pay

## 2024-07-11 ENCOUNTER — Telehealth: Admitting: Internal Medicine

## 2024-07-11 DIAGNOSIS — U071 COVID-19: Secondary | ICD-10-CM

## 2024-07-11 MED ORDER — NIRMATRELVIR/RITONAVIR (PAXLOVID)TABLET
3.0000 | ORAL_TABLET | Freq: Two times a day (BID) | ORAL | 0 refills | Status: DC
Start: 2024-07-11 — End: 2024-07-11

## 2024-07-11 MED ORDER — NIRMATRELVIR/RITONAVIR (PAXLOVID)TABLET: 3.0000 | ORAL_TABLET | Freq: Two times a day (BID) | ORAL | 0 refills | Status: AC

## 2024-07-11 NOTE — Telephone Encounter (Signed)
 Brad Holland scheduled for today .   FYI Only or Action Required?: FYI only for provider.  Patient was last seen in primary care on 03/15/2024 by Perri Ronal PARAS, MD.  Called Nurse Triage reporting Covid Positive.  Symptoms began yesterday.  Interventions attempted: Rest, hydration, or home remedies.  Symptoms are: unchanged.  Triage Disposition: Call PCP Within 24 Hours  Patient/caregiver understands and will follow disposition?: Yes                Copied from CRM #[id number]. Topic: Clinical - Red Word Triage >> Jul 11, 2024 10:55 AM Ivette P wrote: [facility] that prompted transfer to Nurse Triage:  tested postivie for covid,   pt is known for inflammatory conditions.    low fever , congestions, sore throat, headache. Reason for Disposition  [1] HIGH RISK patient (e.g., weak immune system, 65 years and older, obesity with BMI 30 or higher, pregnant, chronic lung disease) AND [2] COVID symptoms (e.g., cough, fever)  (Exceptions: Already seen by PCP and no new or worsening symptoms.)  Answer Assessment - Initial Assessment Questions My chart Brad Holland scheduled for today. Patient reports sx mild but he is immuno compromised. Requesting paxlovid  .   1. SYMPTOMS: What is your main symptom or concern? (e.g., cough, fever, shortness of breath, muscle aches)     Cough, low fever, sore throat , headache 2. ONSET: When did the symptoms start?      Yesterday  3. COUGH: Do you have a cough? If Yes, ask: How bad is the cough?       Yes , mild to moderate  4. FEVER: Do you have a fever? If Yes, ask: What is your temperature, how was it measured, and when did it start?     na 5. BREATHING DIFFICULTY: Are you having any difficulty breathing? (e.g., normal; shortness of breath, wheezing, unable to speak)      no 6. BETTER-SAME-WORSE: Are you getting better, staying the same or getting worse compared to yesterday?  If getting worse, ask, In what way?     Mild sx  7. OTHER  SYMPTOMS: Do you have any other symptoms?  (e.g., chills, fatigue, headache, loss of smell or taste, muscle pain, sore throat)     Headache , low fever, congestion, sore throat , cough . 8. COVID-19 DIAGNOSIS: How do you know that you have COVID? (e.g., positive lab test or self-test, diagnosed by doctor or NP/PA, symptoms after exposure).     At home positive covid test  9. COVID-19 EXPOSURE: Was there any known exposure to COVID before the symptoms began?      na 10. COVID-19 VACCINE: Have you had the COVID-19 vaccine? If Yes, ask: When did you last get it?       na 11. HIGH RISK DISEASE: Do you have any chronic medical problems? (e.g., asthma, heart or lung disease, weak immune system, obesity, etc.)       Hx weak immune system  12. PREGNANCY: Is there any chance you are pregnant? When was your last menstrual period?       na 13. O2 SATURATION MONITOR:  Do you use an oxygen saturation monitor (pulse oximeter) at home? If Yes, ask What is your reading (oxygen level) today? What is your usual oxygen saturation reading? (e.g., 95%)       na  Protocols used: COVID-19 - Diagnosed or Suspected-A-AH

## 2024-07-11 NOTE — Progress Notes (Signed)
 Patient Care Team: Perri Ronal PARAS, MD as PCP - General (Internal Medicine) Alvan Ronal BRAVO, MD (Inactive) as PCP - Cardiology (Cardiology)  I connected with [name] on 07/11/24 at 11:53 am by video enabled telemedicine visit and verified that I am speaking with the correct person using two identifiers.   I discussed the limitations, risks, security and privacy concerns of performing an evaluation and management service by telemedicine and the availability of in-person appointments. I also discussed with the patient that there may be a patient responsible charge related to this service. The patient expressed understanding and agreed to proceed.   Other persons participating in the visit and their role in the encounter: Medical scribe, Nestora JAYSON Bathe  Patient's location: Home   Provider's location: Clinic   I provided  10 minutes of face-to-face video visit time during this encounter, and > 50% was spent counseling as documented under my assessment & plan. Also time  was spent reviewing patient's medical record in addition to interviewing patient. Time  was  also spent with medical decision making and e-scribing medication including checking glomerular filtration rate. Total time spent with this encounter is 20 minutes.  Chief Complaint: Covid-19 symptoms.    Subjective:    Patient ID: Brad Holland , Male    DOB: May 06, 1976, 48 y.o.    MRN: [id number]   48 y.o. Male presents today for sick visit for Covid-19 symptoms. Patient has a past medical history of back pain, Migraine without aura.   He said that he began to feel sick on Saturday. He had a sore throat and then began to experience cough, congestion, and a low fever. He said that he is blowing out green or yellow mucus and that he is not coughing anything up. He has taken 2 Covid-19 test and they were positive. He recently traveled with his wife to [place] vis plane.  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 1-2 per week     Family History  Problem Relation Age of Onset   Colon polyps Mother    Irritable bowel syndrome Mother    Bladder Cancer Father    Colon cancer Neg Hx    Esophageal cancer Neg Hx    Rectal cancer Neg Hx    Stomach cancer Neg Hx         Review of Systems  Constitutional:  Positive for fever.  HENT:  Positive for congestion and sore throat.   Respiratory:  Positive for cough. Negative for sputum production.         Objective:   Vitals: There were no vitals taken for this visit.   Physical Exam Vitals and nursing note reviewed.       Results:     Labs:       Component Value Date/Time   NA 139 11/26/2021 1039   K 4.4 11/26/2021 1039   CL 100 11/26/2021 1039   CO2 29 11/26/2021 1039   GLUCOSE 73 11/26/2021 1039   BUN 14 11/26/2021 1039   CREATININE 0.99 11/26/2021 1039   CALCIUM 9.5 11/26/2021 1039   PROT 6.6 11/26/2021 1039   ALBUMIN 4.7 08/29/2011 0945   AST 20 11/26/2021 1039   ALT 16 11/26/2021 1039   ALKPHOS 47 08/29/2011  0945   BILITOT 0.6 11/26/2021 1039   GFRNONAA 92 02/08/2021 1058   GFRAA 107 02/08/2021 1058     Lab Results  Component Value Date   WBC 5.1 11/26/2021   HGB 17.2 (H) 11/26/2021   HCT 50.1 (H) 11/26/2021   MCV 94.4 11/26/2021   PLT 133 (L) 11/26/2021    Lab Results  Component Value Date   CHOL 186 02/08/2021   HDL 48 02/08/2021   LDLCALC 113 (H) 02/08/2021   TRIG 142 02/08/2021   CHOLHDL 3.9 02/08/2021     Lab Results  Component Value Date   TSH 1.70 11/26/2021         Assessment & Plan:    Meds ordered this encounter  Medications   nirmatrelvir /ritonavir  (PAXLOVID ) 20 x 150 MG & 10 x 100MG  TABS    Sig: Take 3 tablets by mouth 2 (two) times daily for 5 days. (Take nirmatrelvir  150 mg two tablets twice daily for 5 days and ritonavir  100  mg one tablet twice daily for 5 days) Patient GFR is 95 cc/min    Dispense:  30 tablet    Refill:  0    Covid-19 virus infection: He said that he began to feel sick on Saturday. He had a sore throat and then began to experience cough, congestion, and a low fever. He said that he is blowing out green or yellow mucus and that he is not coughing anything up. He has taken 2 Covid-19 test and they were positive. He recently traveled with his wife.    Paxlovid  3 tablets twice daily for 5 days prescribed.     I,Makayla C Reid,acting as a scribe for Ronal JINNY Hailstone, MD.,have documented all relevant documentation on the behalf of Ronal JINNY Hailstone, MD,as directed by  Ronal JINNY Hailstone, MD while in the presence of Ronal JINNY Hailstone, MD.

## 2024-07-11 NOTE — Patient Instructions (Addendum)
 Patient is acute COVID-19 virus infection.  He believes he contracted this virus while traveling to [place] recently.  His symptoms are mild at this time.  Patient is to quarantine at home for 5 days.  He is to rest and stay well-hydrated.  He is to walk some to prevent atelectasis.  We discussed treatment and he is agreeable to taking Paxlovid .  His kidney functions have been reviewed and he qualifies for regular strength Paxlovid  which was sent to his pharmacy.

## 2024-07-27 ENCOUNTER — Other Ambulatory Visit: Payer: Self-pay | Admitting: Internal Medicine

## 2024-09-10 ENCOUNTER — Encounter: Payer: Self-pay | Admitting: Internal Medicine

## 2024-09-10 ENCOUNTER — Telehealth: Payer: Self-pay | Admitting: Internal Medicine

## 2024-09-10 DIAGNOSIS — Z20828 Contact with and (suspected) exposure to other viral communicable diseases: Secondary | ICD-10-CM | POA: Diagnosis not present

## 2024-09-10 MED ORDER — OSELTAMIVIR PHOSPHATE 75 MG PO CAPS: 75.0000 mg | ORAL_CAPSULE | Freq: Two times a day (BID) | ORAL | 0 refills | Status: AC

## 2024-09-10 NOTE — Telephone Encounter (Signed)
 48 year old Male called to say he has tested positive for Influenza A with home test. Has been vaccinated for Influenza A. Likely has a strain of Influenza A not included in this year's vaccine. Has mild symptoms of malaise and fatigue. No vomiting or shaking chills. No significant cough.Wife has similar symptoms. Patient would like prophylactic Tamiflu . Sending in Tamiflu  75 mg twice daily for 5 days.  PMH: Hx of lumbar radiculopathy, anxiety and migraine headaches. Medications reviewed.  Had Covid-19 in October 2025.  Social Hx: Married. 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 Former smoker- not recent. Social ETOH. Married. 2 children.  Family Yk:Qjuyzm with hx of bladder cancer.  Medical history reviewed. No contraindications to Tamiflu .   Sending Rx to [facility].   Time spent with speaking with patient by phone, review of medical record and e-scribing Tamiflu  to pharmacy is 20 minutes.  MJB, MD

## 2024-10-12 ENCOUNTER — Other Ambulatory Visit: Payer: Self-pay | Admitting: Internal Medicine
